# Patient Record
Sex: Male | Born: 1965 | Race: White | Hispanic: No | Marital: Married
Health system: Southern US, Community
[De-identification: ages and names within clinical notes are randomized; demographics above are authoritative.]

## PROBLEM LIST (undated history)

## (undated) DIAGNOSIS — N200 Calculus of kidney: Secondary | ICD-10-CM

## (undated) HISTORY — DX: Calculus of kidney: N20.0

---

## 2003-12-12 ENCOUNTER — Emergency Department (HOSPITAL_COMMUNITY): Admission: EM | Admit: 2003-12-12 | Discharge: 2003-12-12 | Payer: Self-pay | Admitting: Emergency Medicine

## 2007-08-31 ENCOUNTER — Other Ambulatory Visit: Payer: Self-pay

## 2007-08-31 ENCOUNTER — Emergency Department: Payer: Self-pay | Admitting: Internal Medicine

## 2007-09-04 ENCOUNTER — Ambulatory Visit: Payer: Self-pay | Admitting: Urology

## 2007-09-05 ENCOUNTER — Ambulatory Visit: Payer: Self-pay | Admitting: Urology

## 2009-07-14 IMAGING — CR DG ABDOMEN 1V
1 series · 2 of 2 positions shown · non-contrast
Comparison: none

REASON FOR EXAM: pain r flank
COMMENTS:

[Series 1: view not recorded · 0.17mm/px · 2 of 2 slices shown]
[im 1/2]
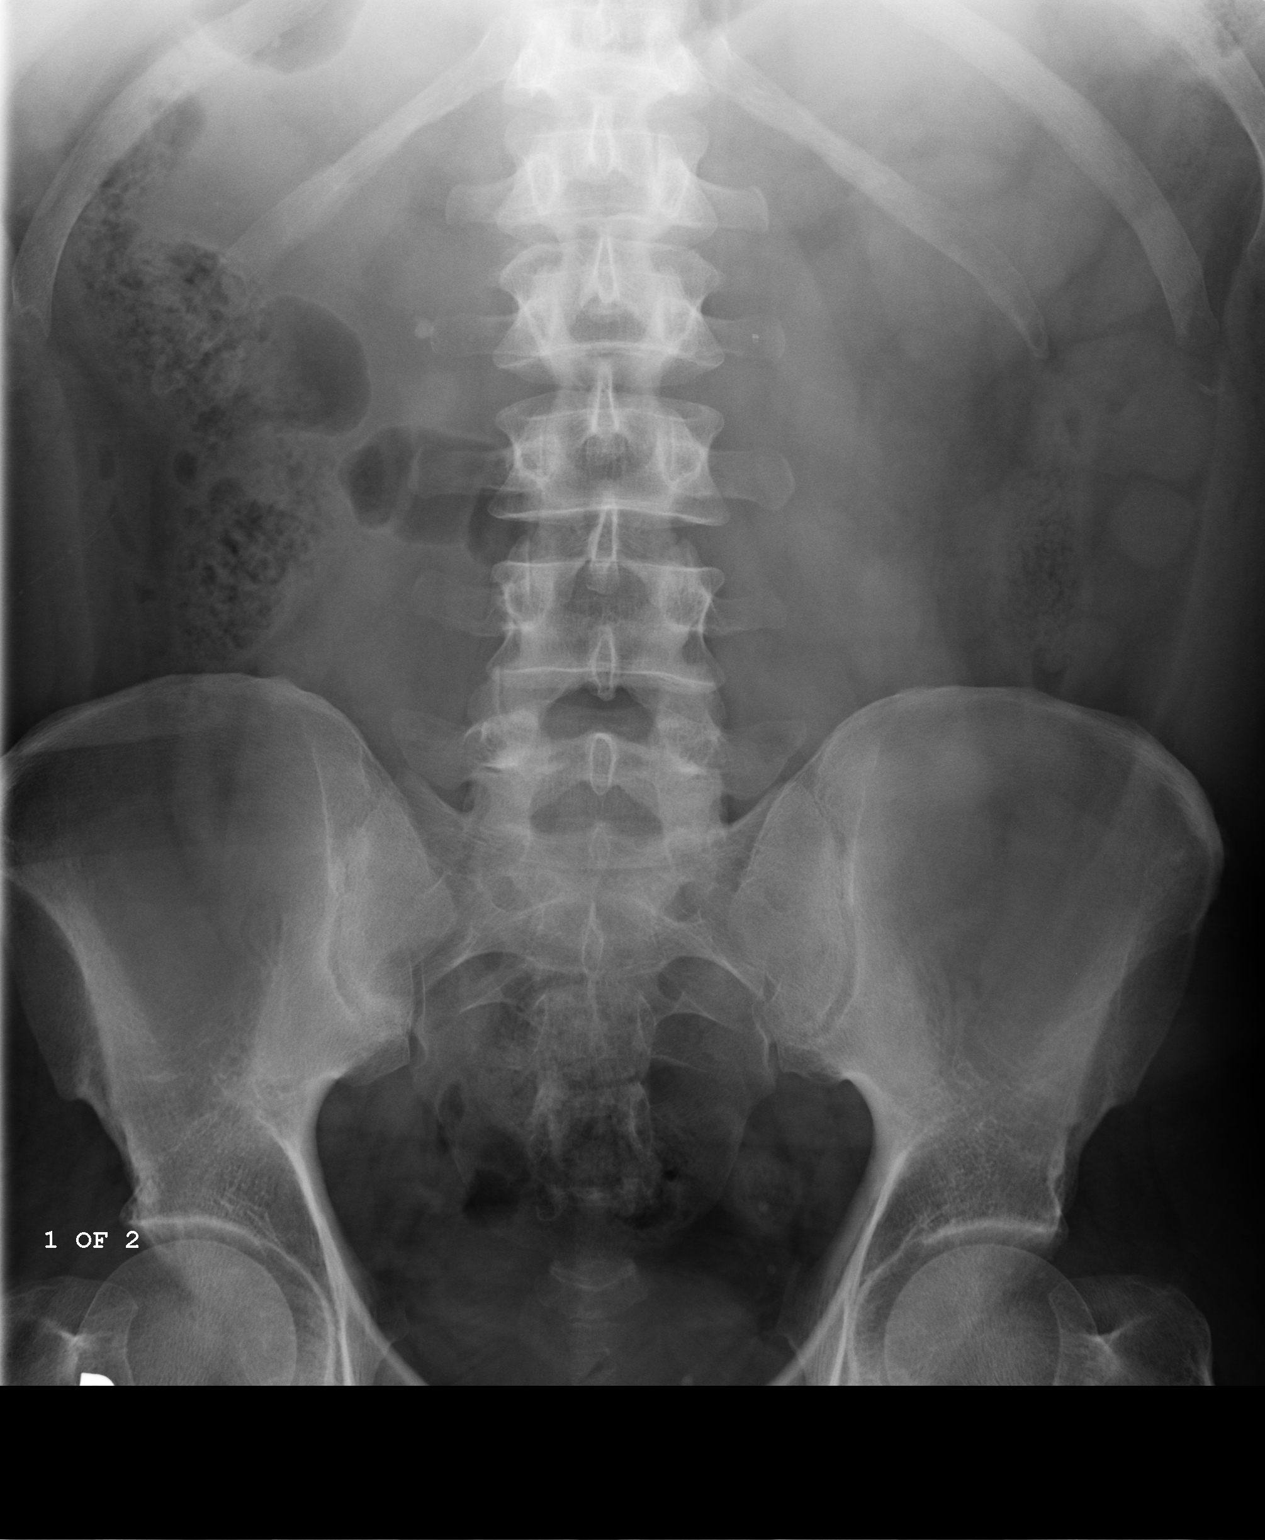
[im 2/2]
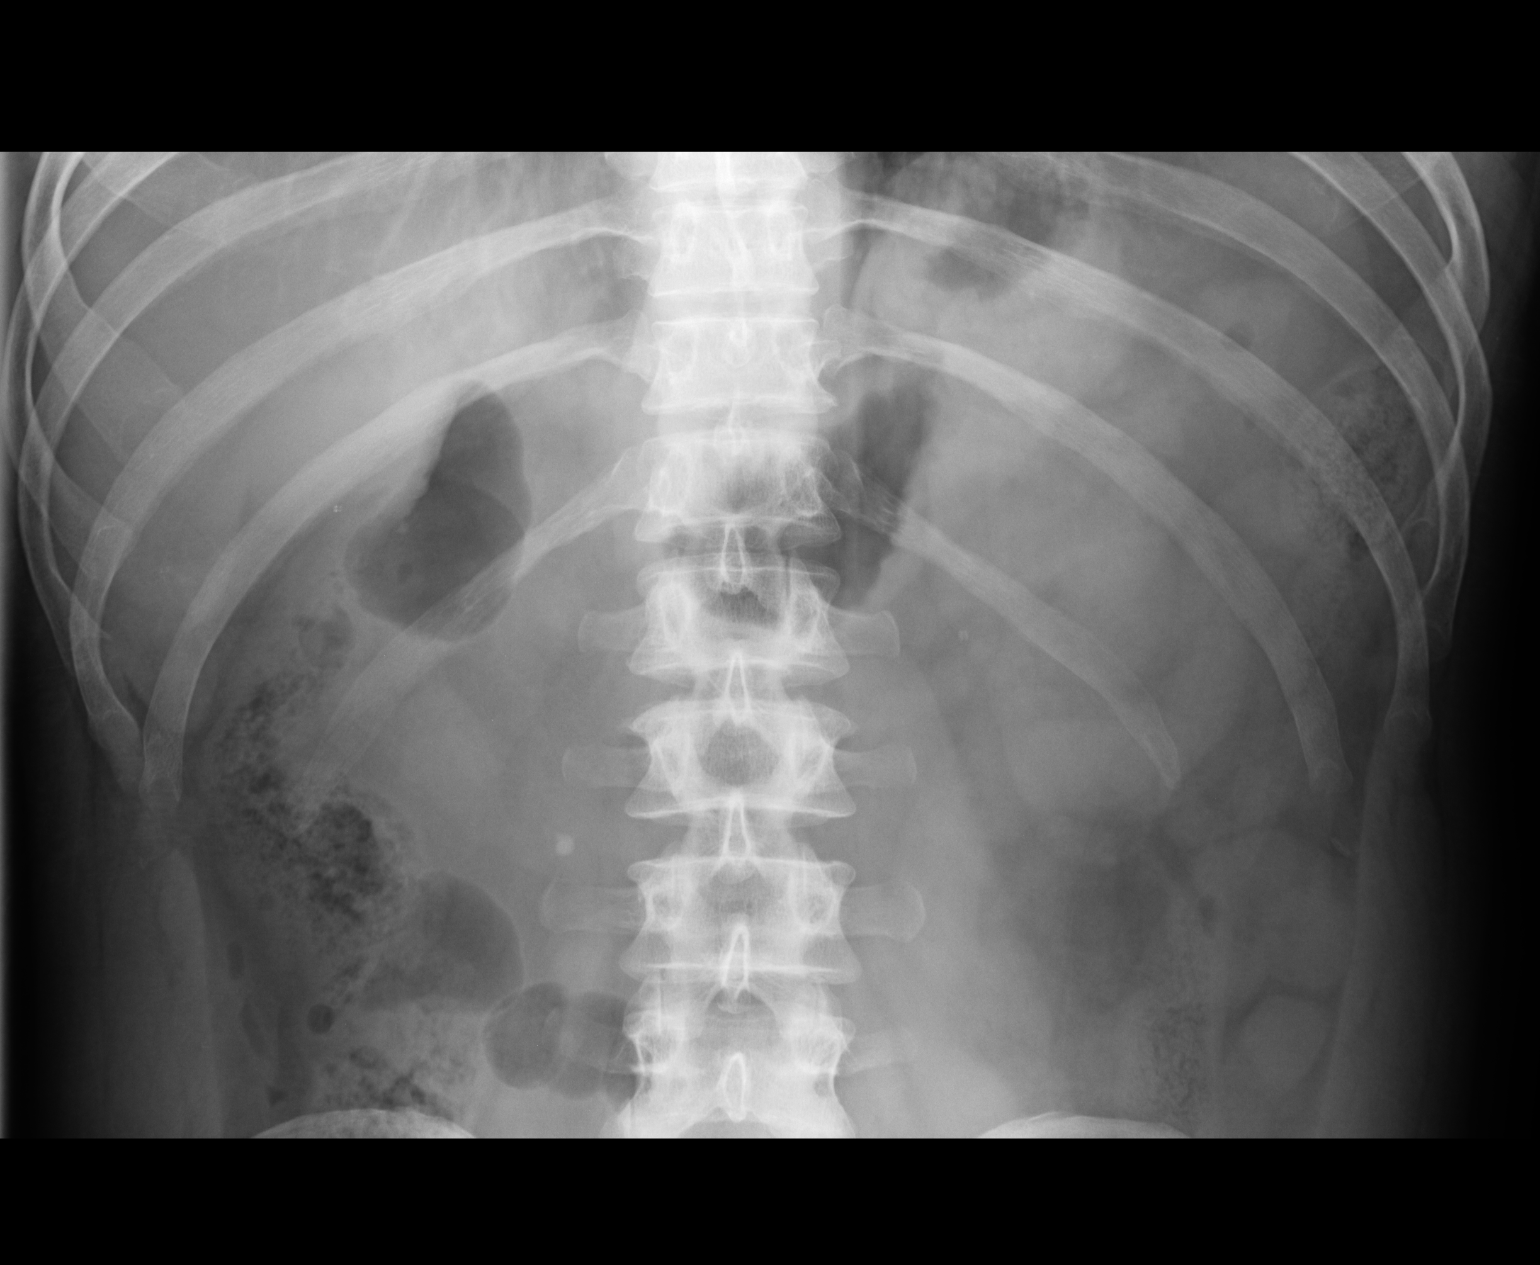

[2 of 2 positions shown; findings below may reference images not displayed]

PROCEDURE:     DXR - DXR KIDNEY URETER BLADDER  - August 31, 2007  [DATE]

RESULT:     The abdominal films reveal a nonspecific bowel gas pattern.
There is a rounded calcification measuring approximately 4 mm in diameter
just above the RIGHT L3 transverse process. This may lie within the proximal
ureter. There is likely a phlebolith in the LEFT aspect of the pelvis. The
bony structures are normal in appearance.
IMPRESSION: There are findings which likely reflect a stone in the
proximal RIGHT ureter. Follow-up CT scanning is available upon request.

## 2010-01-02 ENCOUNTER — Emergency Department: Payer: Self-pay | Admitting: Emergency Medicine

## 2012-09-26 DIAGNOSIS — Z85828 Personal history of other malignant neoplasm of skin: Secondary | ICD-10-CM | POA: Insufficient documentation

## 2014-09-15 DIAGNOSIS — Z8249 Family history of ischemic heart disease and other diseases of the circulatory system: Secondary | ICD-10-CM | POA: Insufficient documentation

## 2014-09-15 DIAGNOSIS — I10 Essential (primary) hypertension: Secondary | ICD-10-CM | POA: Insufficient documentation

## 2016-09-18 DIAGNOSIS — E1165 Type 2 diabetes mellitus with hyperglycemia: Secondary | ICD-10-CM | POA: Insufficient documentation

## 2018-07-16 ENCOUNTER — Other Ambulatory Visit: Payer: Self-pay | Admitting: Internal Medicine

## 2018-07-16 DIAGNOSIS — I10 Essential (primary) hypertension: Secondary | ICD-10-CM

## 2018-07-16 DIAGNOSIS — E119 Type 2 diabetes mellitus without complications: Secondary | ICD-10-CM

## 2018-07-23 ENCOUNTER — Other Ambulatory Visit: Payer: Self-pay | Admitting: Internal Medicine

## 2018-07-23 DIAGNOSIS — E119 Type 2 diabetes mellitus without complications: Secondary | ICD-10-CM

## 2018-07-23 DIAGNOSIS — I10 Essential (primary) hypertension: Secondary | ICD-10-CM

## 2018-07-23 DIAGNOSIS — Z8249 Family history of ischemic heart disease and other diseases of the circulatory system: Secondary | ICD-10-CM

## 2018-07-24 ENCOUNTER — Ambulatory Visit
Admission: RE | Admit: 2018-07-24 | Discharge: 2018-07-24 | Disposition: A | Discharge status: Home / Self Care | Payer: BC Managed Care – PPO | Source: Ambulatory Visit | Attending: Internal Medicine | Admitting: Internal Medicine

## 2018-07-24 DIAGNOSIS — E119 Type 2 diabetes mellitus without complications: Secondary | ICD-10-CM | POA: Insufficient documentation

## 2018-07-24 DIAGNOSIS — Z8249 Family history of ischemic heart disease and other diseases of the circulatory system: Secondary | ICD-10-CM | POA: Diagnosis present

## 2018-07-24 DIAGNOSIS — I10 Essential (primary) hypertension: Secondary | ICD-10-CM | POA: Diagnosis present

## 2018-12-25 DIAGNOSIS — D126 Benign neoplasm of colon, unspecified: Secondary | ICD-10-CM | POA: Insufficient documentation

## 2019-05-27 ENCOUNTER — Other Ambulatory Visit: Payer: Self-pay

## 2019-05-27 DIAGNOSIS — Z20822 Contact with and (suspected) exposure to covid-19: Secondary | ICD-10-CM

## 2019-05-28 LAB — NOVEL CORONAVIRUS, NAA: SARS-CoV-2, NAA: DETECTED — AB

## 2019-05-30 ENCOUNTER — Telehealth: Payer: Self-pay | Admitting: Unknown Physician Specialty

## 2019-05-30 NOTE — Telephone Encounter (Signed)
I connected by phone with Tanner Schaefer on 05/30/2019 at 12:49 PM to discuss the potential use of an new treatment for mild to moderate COVID-19 viral infection in non-hospitalized patients.3 days of symptoms with temperature and flu like symptoms.    This patient is a 53 y.o. male that meets the FDA criteria for Emergency Use Authorization of bamlanivimab or casirivimab\imdevimab.  Has a (+) direct SARS-CoV-2 viral test result  Has mild or moderate COVID-19   Is ? 51 years of age and weighs ? 40 kg  Is NOT hospitalized due to COVID-19  Is NOT requiring oxygen therapy or requiring an increase in baseline oxygen flow rate due to COVID-19  Is within 10 days of symptom onset  Has at least one of the high risk factor(s) for progression to severe COVID-19 and/or hospitalization as defined in EUA.  Specific high risk criteria : Diabetes   I have spoken and communicated the following to the patient or parent/caregiver:  1. FDA has authorized the emergency use of bamlanivimab and casirivimab\imdevimab for the treatment of mild to moderate COVID-19 in adults and pediatric patients with positive results of direct SARS-CoV-2 viral testing who are 14 years of age and older weighing at least 40 kg, and who are at high risk for progressing to severe COVID-19 and/or hospitalization.  2. The significant known and potential risks and benefits of bamlanivimab and casirivimab\imdevimab, and the extent to which such potential risks and benefits are unknown.  3. Information on available alternative treatments and the risks and benefits of those alternatives, including clinical trials.  4. Patients treated with bamlanivimab and casirivimab\imdevimab should continue to self-isolate and use infection control measures (e.g., wear mask, isolate, social distance, avoid sharing personal items, clean and disinfect "high touch" surfaces, and frequent handwashing) according to CDC guidelines.   5. The patient or  parent/caregiver has the option to accept or refuse bamlanivimab or casirivimab\imdevimab .  After reviewing this information with the patient, The patient has DECLINED offer to receive the infusion.  Kathrine Haddock 05/30/2019 12:49 PM

## 2019-09-30 DIAGNOSIS — Z8616 Personal history of COVID-19: Secondary | ICD-10-CM | POA: Insufficient documentation

## 2020-03-11 IMAGING — US US AORTA SCREENING (MEDICARE)
1 series · 14 of 20 positions shown · non-contrast
Comparison: CT abdomen and pelvis - 08/21/2007

CLINICAL DATA: Family history of abdominal aortic aneurysm.
Personal history of hypertension and diabetes.

EXAM:
ULTRASOUND OF ABDOMINAL AORTA
TECHNIQUE: Ultrasound examination of the abdominal aorta and proximal common
iliac arteries was performed to evaluate for aneurysm. Additional
color and Doppler images of the distal aorta were obtained to
document patency.

[Series 1: us aorta screening (medicare) · 0.31mm/px · 14 of 20 slices shown]
[im 1/20]
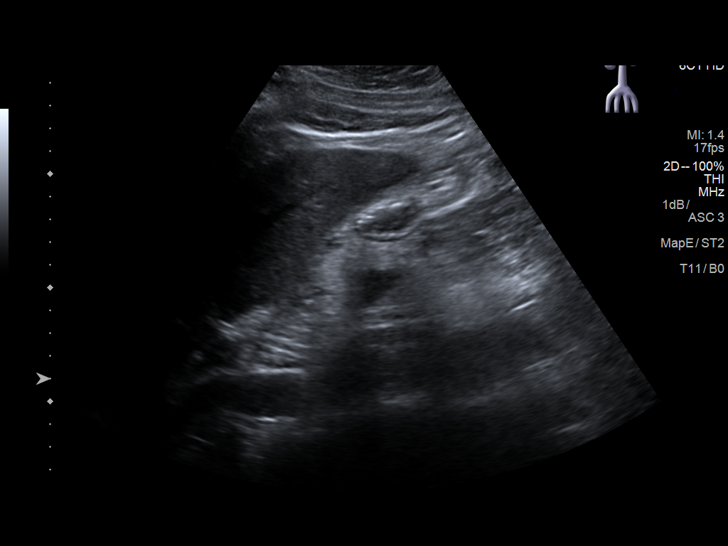
[im 3/20]
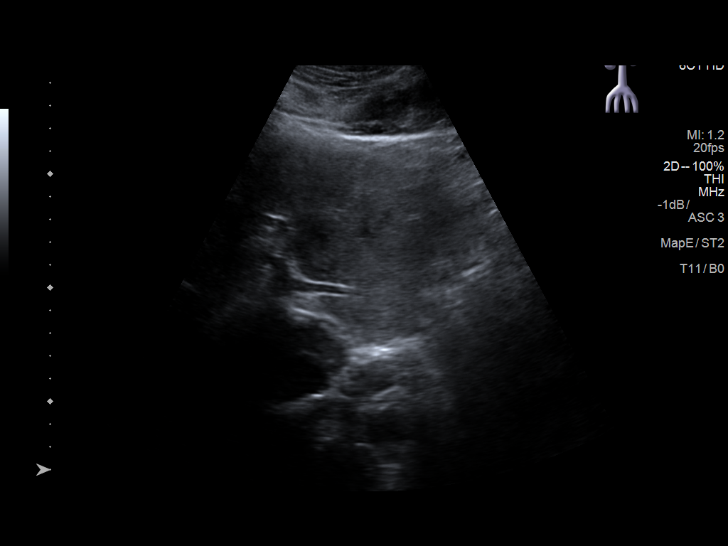
[im 4/20]
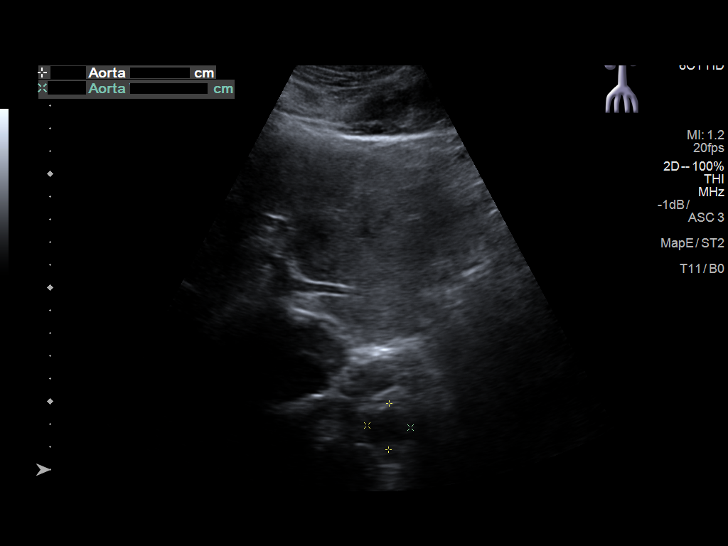
[im 6/20]
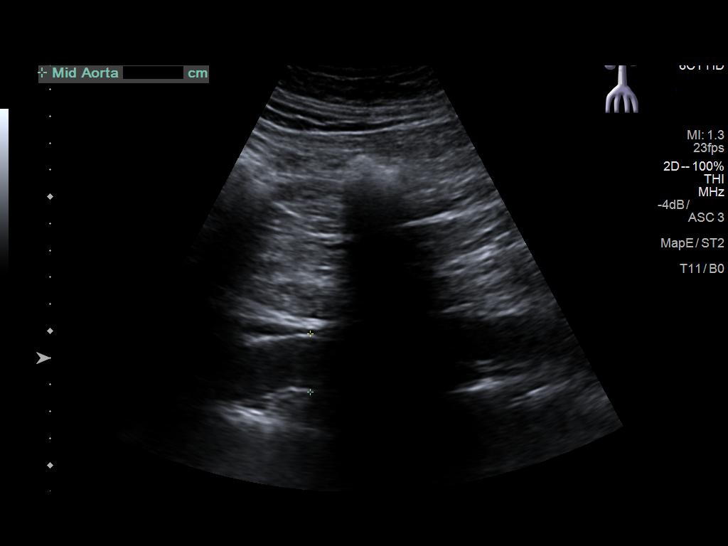
[im 7/20]
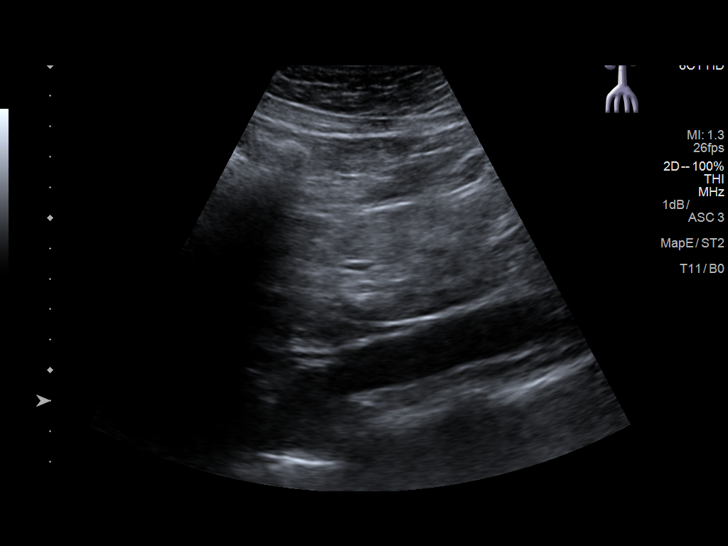
[im 8/20]
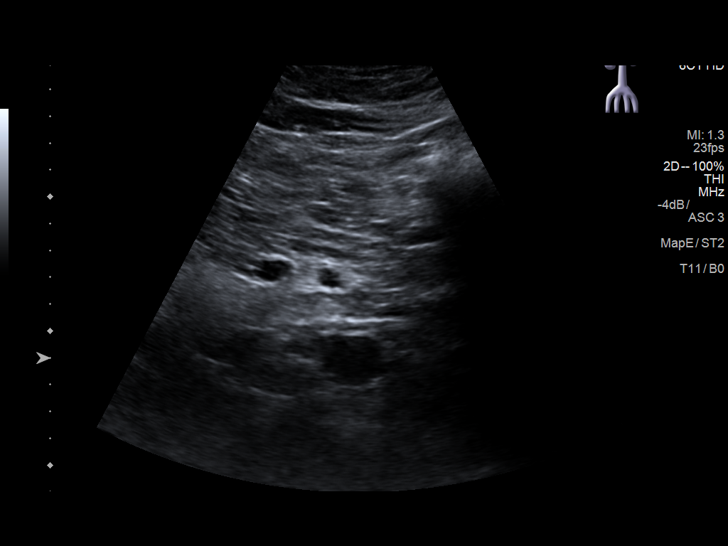
[im 10/20]
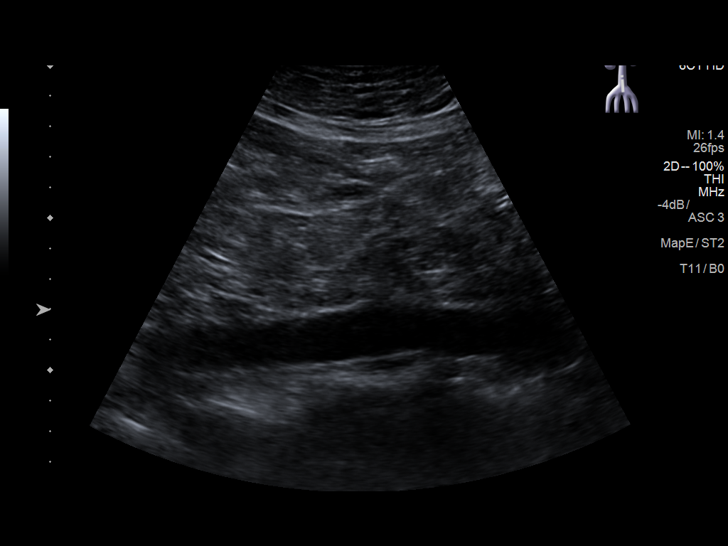
[im 11/20]
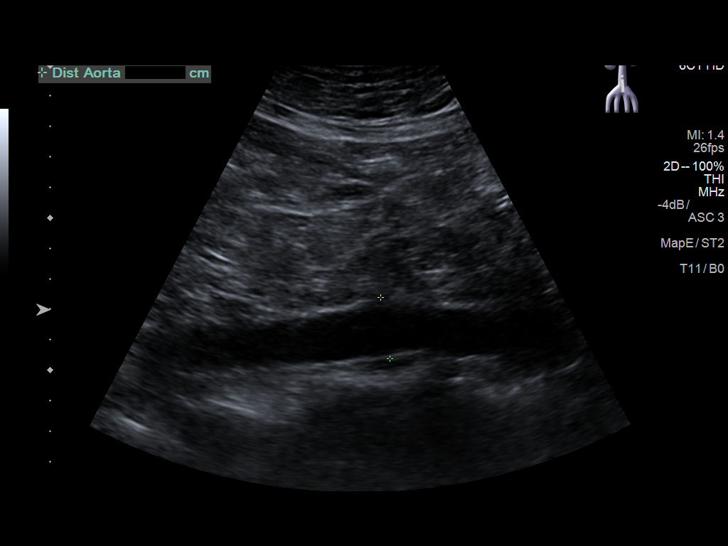
[im 13/20]
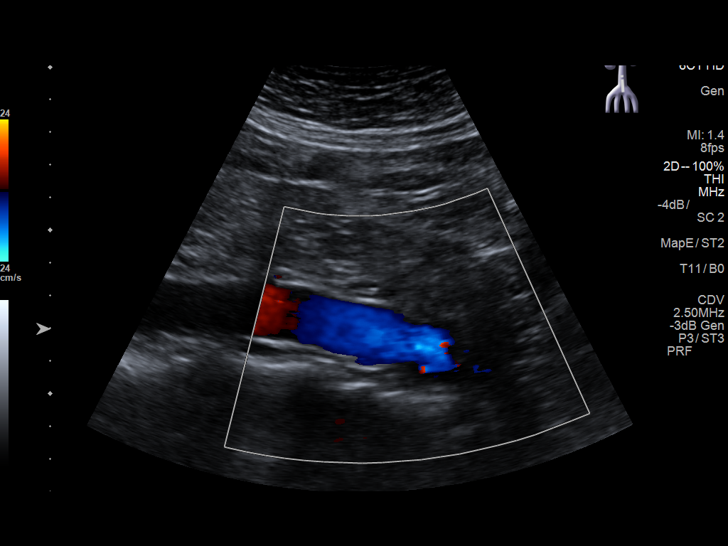
[im 14/20]
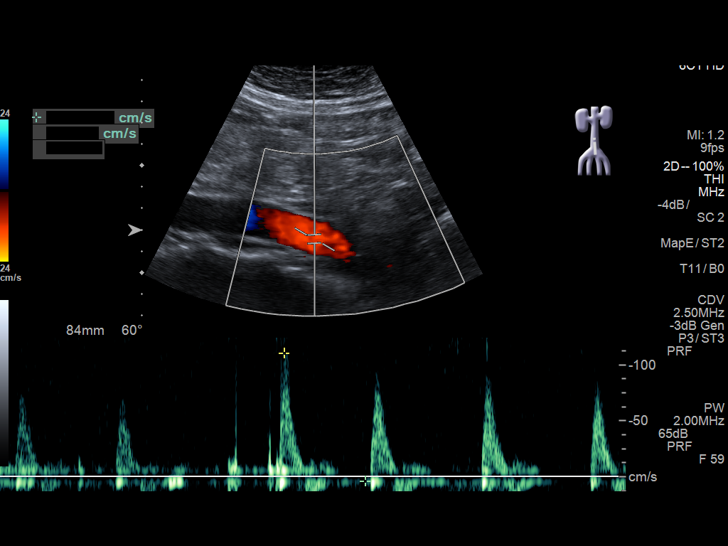
[im 16/20]
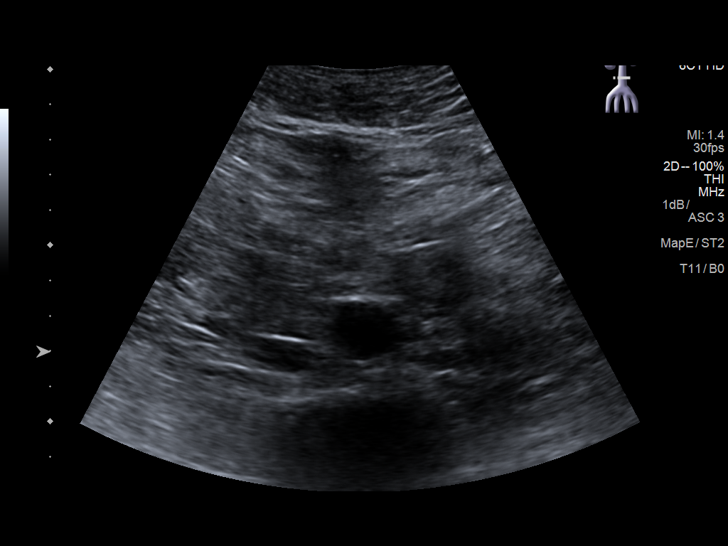
[im 17/20]
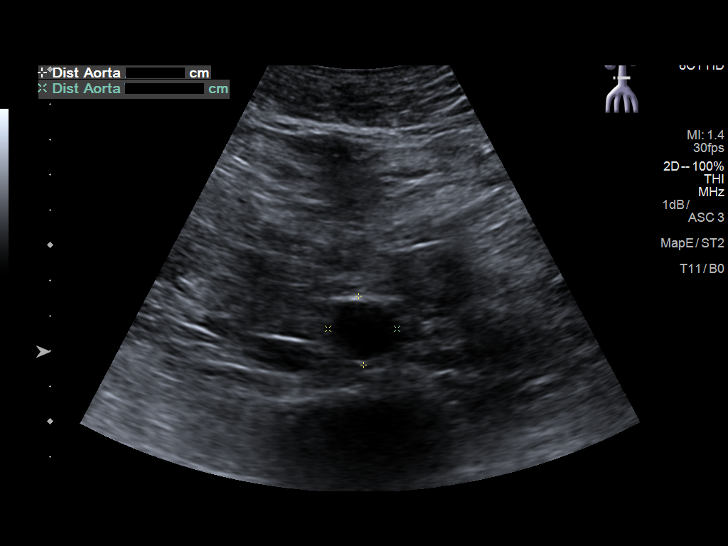
[im 18/20]
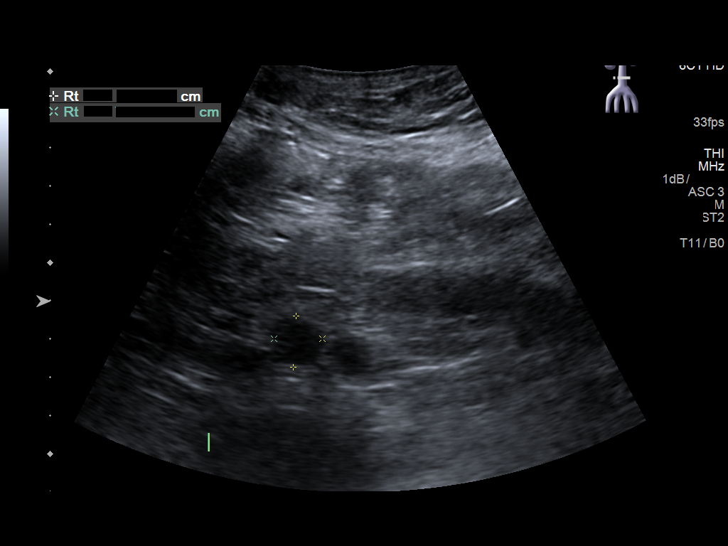
[im 20/20]
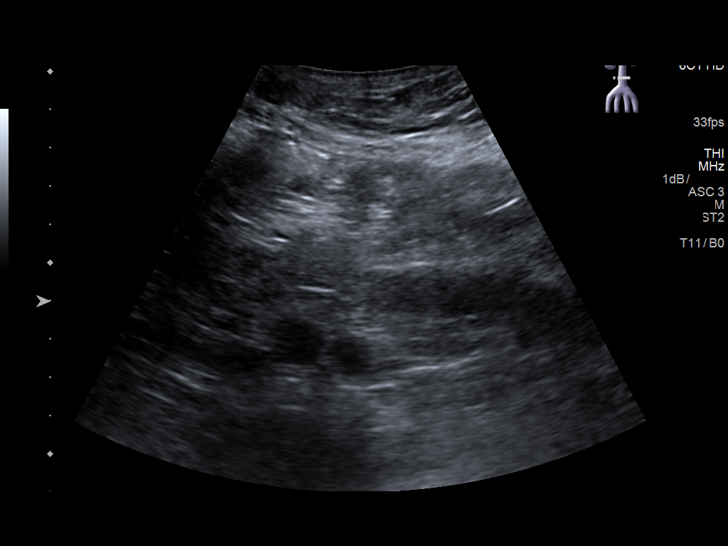

[14 of 20 positions shown; findings below may reference images not displayed]

FINDINGS: Abdominal aortic measurements as follows:

Proximal:  2.4 x 1.9 cm

Mid:  2.3 x 2.2 cm

Distal:  2.0 x 2.0 cm
Patent: Yes, peak systolic velocity is 111 cm/s

Right common iliac artery: 1.3 x 1.3 cm

Left common iliac artery: 1.3 x 1.1 cm
IMPRESSION: No evidence of abdominal aortic aneurysm.

## 2021-04-05 ENCOUNTER — Other Ambulatory Visit: Payer: Self-pay | Admitting: Internal Medicine

## 2021-04-05 DIAGNOSIS — Z8249 Family history of ischemic heart disease and other diseases of the circulatory system: Secondary | ICD-10-CM

## 2021-04-15 ENCOUNTER — Ambulatory Visit: Payer: BC Managed Care – PPO

## 2021-04-22 ENCOUNTER — Ambulatory Visit
Admission: RE | Admit: 2021-04-22 | Discharge: 2021-04-22 | Disposition: A | Discharge status: Home / Self Care | Payer: BC Managed Care – PPO | Source: Ambulatory Visit | Attending: Internal Medicine | Admitting: Internal Medicine

## 2021-04-22 ENCOUNTER — Other Ambulatory Visit: Payer: Self-pay

## 2021-04-22 DIAGNOSIS — Z8249 Family history of ischemic heart disease and other diseases of the circulatory system: Secondary | ICD-10-CM | POA: Insufficient documentation

## 2023-03-06 IMAGING — US US AORTA
2 series · 15 of 25 positions shown · non-contrast
Comparison: 07/24/2018

CLINICAL DATA: Family history of abdominal aortic aneurysm and
history hypertension.

EXAM:
ULTRASOUND OF ABDOMINAL AORTA
TECHNIQUE: Ultrasound examination of the abdominal aorta and proximal common
iliac arteries was performed to evaluate for aneurysm. Additional
color and Doppler images of the distal aorta were obtained to
document patency.

[Series 1: us aaa duplex (id) · 9 of 29 slices shown (1 of 2)]
[im 1/29]
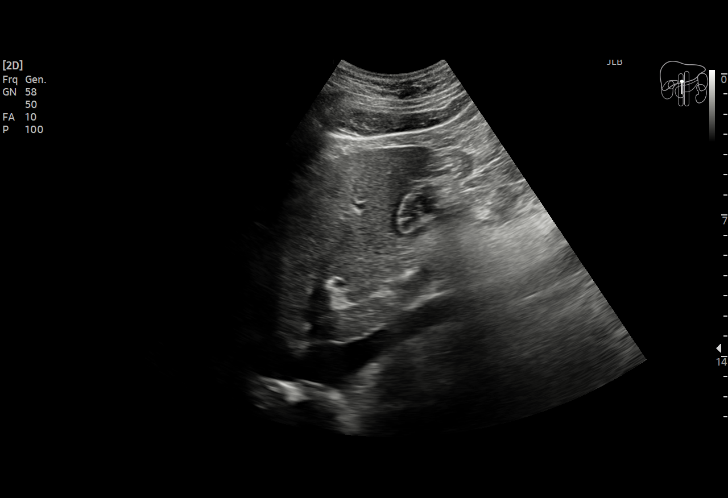
[im 5/29]
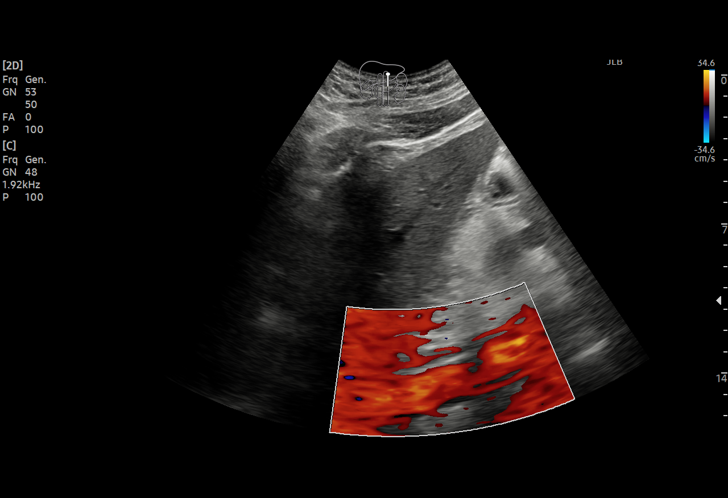
[im 9/29]
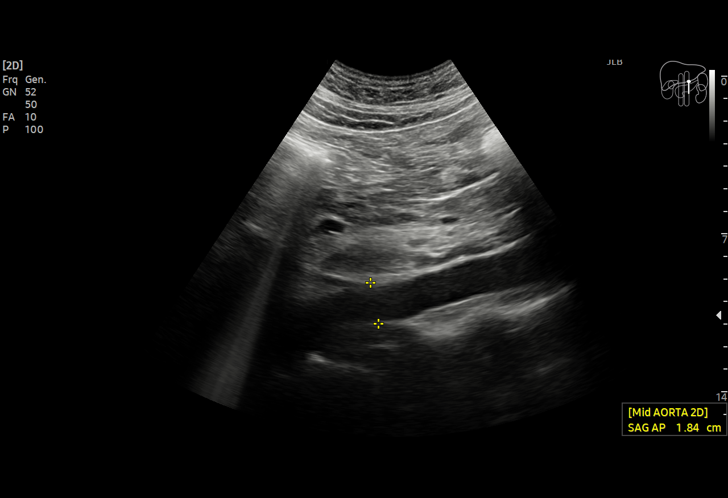
[im 11/29]
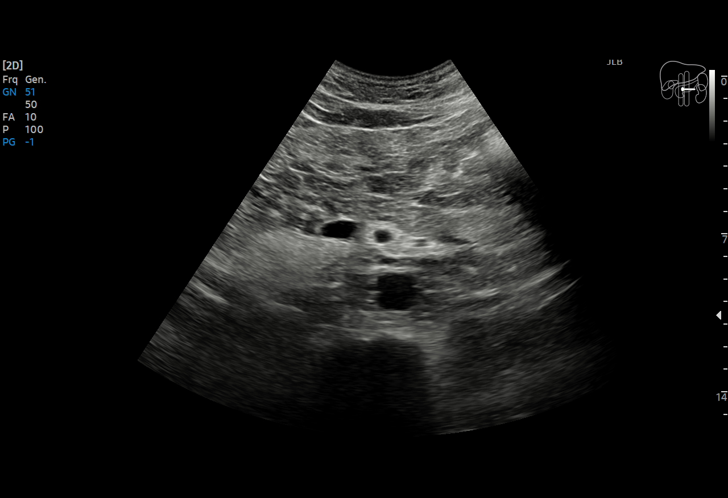
[im 15/29]
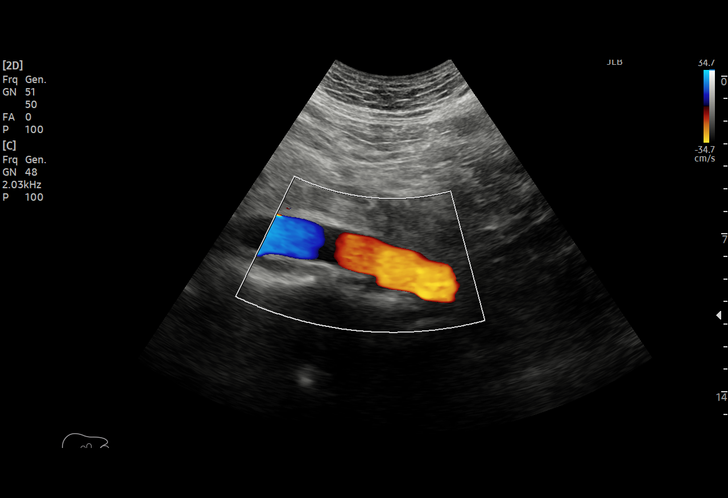
[im 19/29]
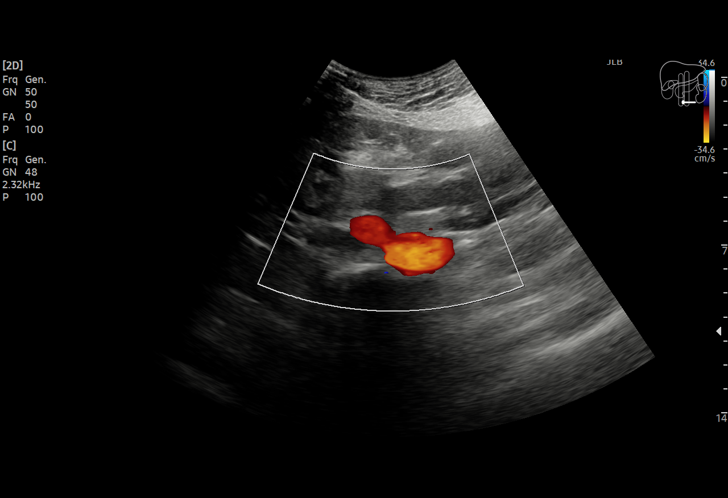
[im 21/29]
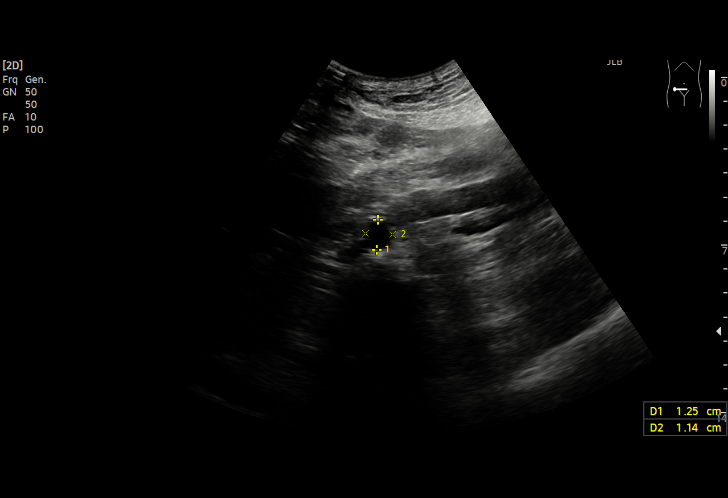
[im 25/29]
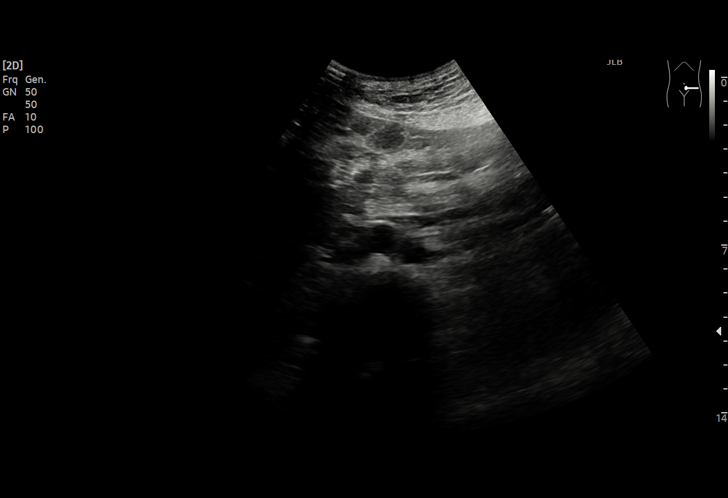
[im 29/29]
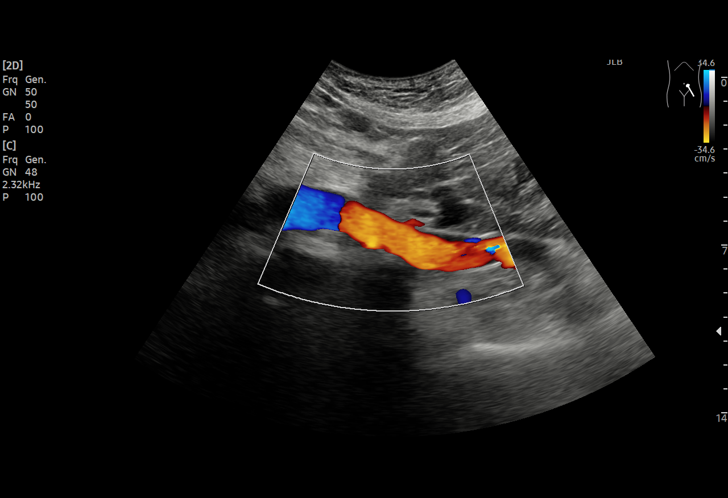

[Series 3: us aaa duplex (id) · 6 of 20 slices shown (2 of 2)]
[im 1/20]
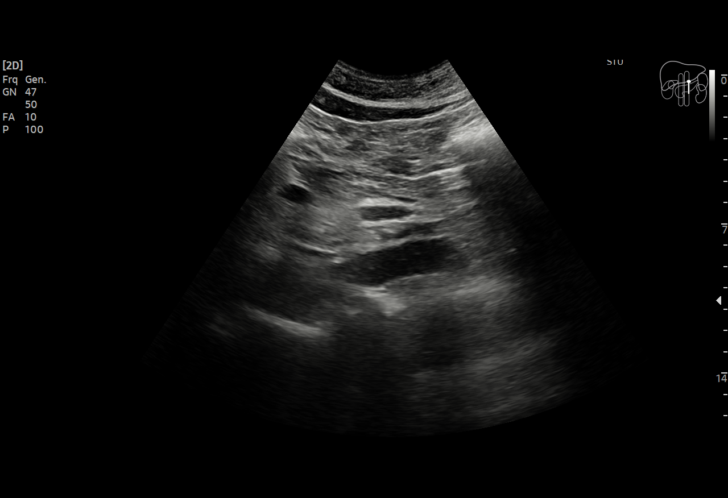
[im 5/20]
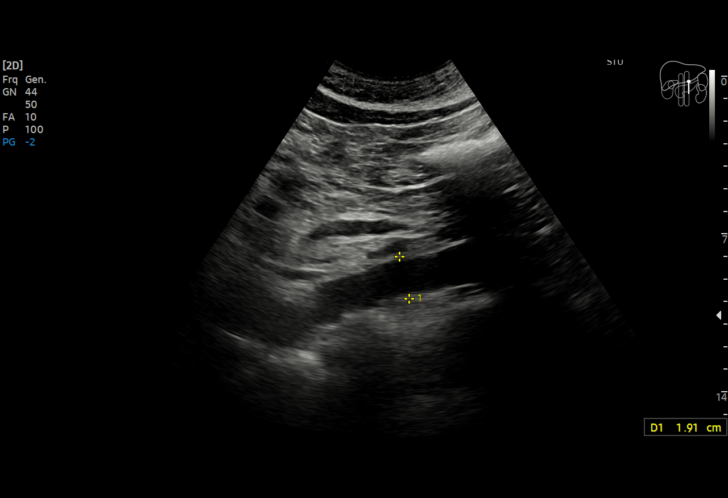
[im 9/20]
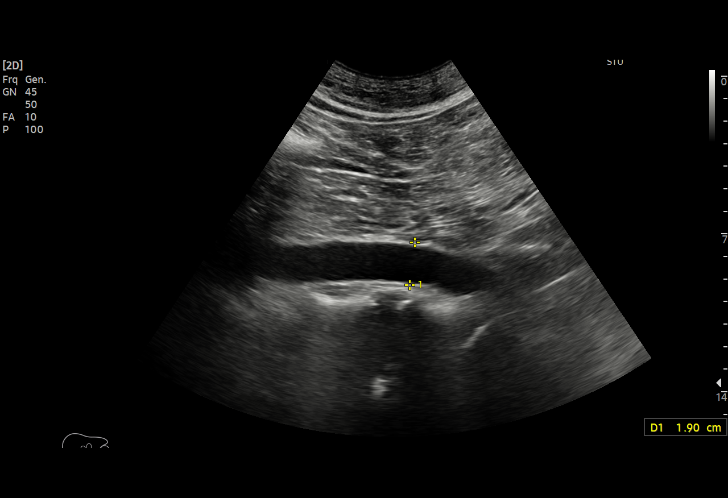
[im 11/20]
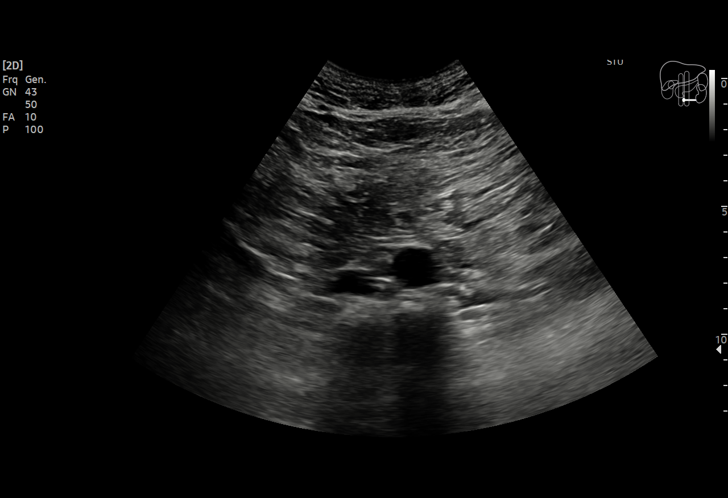
[im 15/20]
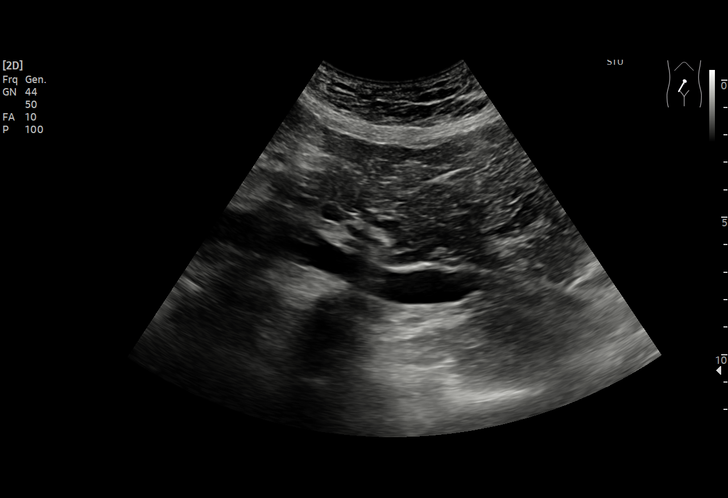
[im 20/20]
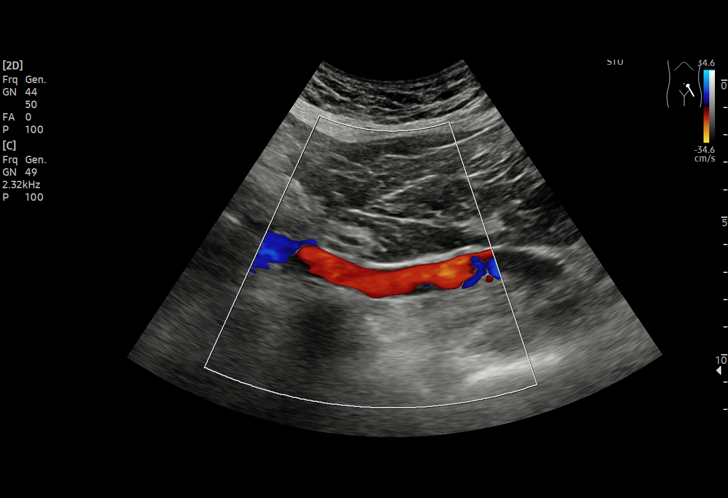

[15 of 25 positions shown; findings below may reference images not displayed]

FINDINGS: Abdominal aortic measurements as follows:

Proximal:  1.9 x 2.1 cm

Mid:  1.8 x 1.9 cm

Distal:  1.9 x 1.9 cm
Patent: Yes, peak systolic velocity is 100 cm/s

Stable mild atherosclerosis of the abdominal aorta.

Right common iliac artery: 1.3 cm

Left common iliac artery: 1.3 cm
IMPRESSION: Stable mild atherosclerosis of the abdominal aorta. No evidence of
aneurysmal disease.

## 2023-04-16 DIAGNOSIS — R7689 Other specified abnormal immunological findings in serum: Secondary | ICD-10-CM | POA: Insufficient documentation

## 2024-03-12 ENCOUNTER — Other Ambulatory Visit: Payer: Self-pay | Admitting: Internal Medicine

## 2024-03-12 ENCOUNTER — Ambulatory Visit
Admission: RE | Admit: 2024-03-12 | Discharge: 2024-03-12 | Disposition: A | Discharge status: Home / Self Care | Source: Ambulatory Visit | Attending: Internal Medicine | Admitting: Internal Medicine

## 2024-03-12 DIAGNOSIS — R109 Unspecified abdominal pain: Secondary | ICD-10-CM | POA: Diagnosis present

## 2024-03-12 DIAGNOSIS — R1032 Left lower quadrant pain: Secondary | ICD-10-CM | POA: Insufficient documentation

## 2024-03-20 ENCOUNTER — Other Ambulatory Visit: Payer: Self-pay | Admitting: Urology

## 2024-03-20 ENCOUNTER — Other Ambulatory Visit: Payer: Self-pay

## 2024-03-20 ENCOUNTER — Ambulatory Visit
Admission: RE | Admit: 2024-03-20 | Discharge: 2024-03-20 | Disposition: A | Discharge status: Home / Self Care | Attending: Urology | Admitting: Urology

## 2024-03-20 ENCOUNTER — Encounter: Payer: Self-pay | Admitting: Urology

## 2024-03-20 ENCOUNTER — Ambulatory Visit: Admitting: Urology

## 2024-03-20 ENCOUNTER — Ambulatory Visit
Admission: RE | Admit: 2024-03-20 | Discharge: 2024-03-20 | Disposition: A | Discharge status: Home / Self Care | Source: Ambulatory Visit | Attending: Urology | Admitting: Urology

## 2024-03-20 VITALS — BP 178/99 | HR 71 | Ht 67.0 in | Wt 215.4 lb

## 2024-03-20 DIAGNOSIS — N201 Calculus of ureter: Secondary | ICD-10-CM | POA: Diagnosis not present

## 2024-03-20 MED ORDER — KETOROLAC TROMETHAMINE 10 MG PO TABS: 10.0000 mg | ORAL_TABLET | Freq: Four times a day (QID) | ORAL | 0 refills | Status: AC | PRN

## 2024-03-20 MED ORDER — TAMSULOSIN HCL 0.4 MG PO CAPS: 0.4000 mg | ORAL_CAPSULE | Freq: Every day | ORAL | 0 refills | Status: DC

## 2024-03-20 NOTE — Progress Notes (Signed)
   03/20/24 9:29 AM   Tanner Schaefer 11/11/1965 [id number]  CC: Left ureteral stone  HPI: 58 year old male who presented to his PCP on 03/12/2024 with left-sided flank pain, urinalysis showed microscopic hematuria and CT showed a 5 mm left proximal ureteral stone with hydronephrosis.  Small bilateral nonobstructive renal stones as well.  His pain has resolved over the last week.  He denies any fevers or chills.  He has a history of recurrent stones at least 3 previously, has required shockwave and ureteroscopy in the distant past.  He recently started some leftover Flomax he had at home.   PMH: Past Medical History:  Diagnosis Date   Kidney stone     Surgical History: History reviewed. No pertinent surgical history.    Family History: History reviewed. No pertinent family history.  Social History:  reports that he does not use drugs. No history on file for tobacco use and alcohol use.  Physical Exam: BP (!) 178/99 (BP Location: Left Arm, Patient Position: Sitting, Cuff Size: Large)   Pulse 71   Ht 5' 7 (1.702 m)   Wt 215 lb 6.4 oz (97.7 kg)   SpO2 96%   BMI 33.74 kg/m    Constitutional:  Alert and oriented, No acute distress. Cardiovascular: No clubbing, cyanosis, or edema. Respiratory: Normal respiratory effort, no increased work of breathing. GI: Abdomen is soft, nontender, nondistended, no abdominal masses   Laboratory Data: See HPI  Pertinent Imaging: I have personally viewed and interpreted the KUB today that appears to show migration of the 5 mm stone to the mid ureter overlying the pelvis.  Assessment & Plan:   58 year old male with a 5 mm left mid ureteral stone with no laboratory or clinical evidence of infection.  We discussed various treatment options for urolithiasis including observation with or without medical expulsive therapy, shockwave lithotripsy (SWL), ureteroscopy and laser lithotripsy with stent placement, and percutaneous nephrolithotomy.We  discussed that management is based on stone size, location, density, patient co-morbidities, and patient preference. Stones <62mm in size have a >80% spontaneous passage rate. Data surrounding the use of tamsulosin for medical expulsive therapy is controversial, but meta analyses suggests it is most efficacious for distal stones between 5-23mm in size. Possible side effects include dizziness/lightheadedness, and retrograde ejaculation.SWL has a lower stone free rate in a single procedure, but also a lower complication rate compared to ureteroscopy and avoids a stent and associated stent related symptoms. Possible complications include renal hematoma, steinstrasse, and need for additional treatment.Ureteroscopy with laser lithotripsy and stent placement has a higher stone free rate than SWL in a single procedure, however increased complication rate including possible infection, ureteral injury, bleeding, and stent related morbidity. Common stent related symptoms include dysuria, urgency/frequency, and flank pain.  He prefers trial of medical expulsive therapy, will coordinate shockwave lithotripsy in 2 weeks, clinic visit a few days prior for repeat x-ray, can cancel if he passes his stone Flomax sent in Return precautions discussed with  Redell Burnet, MD 03/20/2024  [facility] [address] [place], [place] [address] [phone]

## 2024-03-20 NOTE — Progress Notes (Signed)
 ESWL ORDER FORM  Expected date of procedure: ~2 weeks (needs clinic visit Monday or Tuesday prior to procedure for repeat KUB and symptom check, UA) can be with me or PA  Surgeon: MD on truck  Post op standing: 2-4wk follow up w/KUB prior  Anticoagulation/Aspirin/NSAID standing order: Hold all 72 hours prior  Anesthesia standing order: MAC  VTE standing: SCD's  Dx: Left Ureteral Stone  Procedure: left Extracorporeal shock wave lithotripsy  CPT : 50590  Standing Order Set:   *NPO after mn, KUB  *NS 141ml/hr, Keflex 500mg  PO, Benadryl 25mg  PO, Valium 10mg  PO, Zofran 4mg  IV  Medications if other than standing orders:   NONE

## 2024-03-20 NOTE — Progress Notes (Signed)
    [facility] ESWL POSTING SHEET        Patient Name: Tanner Schaefer  DOB: 09/18/1965  MRN: [id number]  Surgeon:  Redell Burnet, MD  Diagnosis:  Left Nephrolithiasis  CPT: 49409  ESWL DATE: 04/03/2024  ESWL TIME: 0730  Special Needs/Requirements: None       Cardiac/Medical/Pulmonary Clearance needed: No       Form Faxed to [facility]- [phone] Date:   Date: 03/20/24       Form Faxed to [place]- [phone]  Date:  Date: 03/20/24           Copy Made for Insurance PA:  Date: 03/20/24       Orders Entered in to Epic:  Date: 03/20/24              7

## 2024-03-20 NOTE — Patient Instructions (Signed)
 ESWL for Kidney Stones  Extracorporeal shock wave lithotripsy (ESWL) is a treatment that can help break up kidney stones that are too large to pass on their own.  This is a nonsurgical procedure that breaks up a kidney stone with shock waves. These shock waves pass through your body and focus on the kidney stone. They cause the kidney stone to break into smaller pieces (fragments) while it is still in the urinary tract. The fragments of stone can pass more easily out of your body in the pee (urine). Tell a health care provider about: Any allergies you have. All medicines you are taking, including vitamins, herbs, eye drops, creams, and over-the-counter medicines. Any problems you or family members have had with anesthetic medicines. Any bleeding problems you have. Any surgeries you've had. Any medical conditions you have. Whether you're pregnant or may be pregnant. What are the risks? Your health care provider will talk with you about risks. These may include: Infection. Bleeding from the kidney. Bruising of the kidney or skin. Scarring of the kidney. This can lead to: Increased blood pressure. Poor kidney function. Return (recurrence) of kidney stones. Damage to other structures or organs. This may include the liver, colon, spleen, or pancreas. Blockage (obstruction) of the tube that carries pee from the kidney to the bladder (ureter). Failure of the kidney stone to break into fragments. What happens before the procedure? When to stop eating and drinking Follow instructions from your health care provider about what you may eat and drink. These may include: 8 hours before your procedure Stop eating most foods. Do not eat meat, fried foods, or fatty foods. Eat only light foods, such as toast or crackers. All liquids are okay except energy drinks and alcohol. 6 hours before your procedure Stop eating. Drink only clear liquids, such as water, clear fruit juice, black coffee, plain tea,  and sports drinks. Do not drink energy drinks or alcohol. 2 hours before your procedure Stop drinking all liquids. You may be allowed to take medicines with small sips of water. If you do not follow your health care provider's instructions, your procedure may be delayed or canceled. Medicines Ask your health care provider about: Changing or stopping your regular medicines. These include any diabetes medicines or blood thinners you take. Taking medicines such as aspirin and ibuprofen. These medicines can thin your blood. Do not take them unless your health care provider tells you to. Taking over-the-counter medicines, vitamins, herbs, and supplements. Tests You may have tests, such as: Blood tests. Pee (urine) tests. Imaging tests. This may include a CT scan. Surgery safety Ask your health care provider: How your surgery site will be marked. What steps will be taken to help prevent infection. These steps may include: Washing skin with a soap that kills germs. Receiving antibiotics. General instructions If you will be going home right after the procedure, plan to have a responsible adult: Take you home from the hospital or clinic. You will not be allowed to drive. Care for you for the time you are told. What happens during the procedure?  An IV will be inserted into one of your veins. You may be given: A sedative. This helps you relax. Anesthesia. This will: Numb certain areas of your body. Make you fall asleep for surgery. A water-filled cushion may be placed behind your kidney or on your abdomen. In some cases, you may be placed in a tub of lukewarm water. Your body will be positioned in a way that makes it  easier to target the kidney stone. An X-ray or ultrasound exam will be done to locate your stone. Shock waves will be aimed at the stone. If you are awake, you may feel a tapping sensation as the shock waves pass through your body. A small mesh tube (stent) may be placed in  your ureter. This will help keep pee flowing from the kidney if the fragments of the stone have been blocking the ureter. The stent will be removed at a later time by your health care provider. The procedure may vary among health care providers and hospitals. What happens after the procedure? Your blood pressure, heart rate, breathing rate, and blood oxygen level will be monitored until you leave the hospital or clinic. You may have an X-ray after the procedure to see how many of the kidney stones were broken up. This will also show how much of the stone has passed. If there are still large fragments after treatment, you may need to have a second procedure at a later time. This information is not intended to replace advice given to you by your health care provider. Make sure you discuss any questions you have with your health care provider.

## 2024-03-21 ENCOUNTER — Encounter

## 2024-03-30 NOTE — Progress Notes (Unsigned)
 03/31/2024 9:22 AM   Tanner Schaefer [birth year] [id number]  Referring provider: Sadie Manna, MD [address] [facility] [place],  [place] [address]  Urological history: 1. Nephrolithiasis - remote history of ESWL x 3  No chief complaint on file.  HPI: Tanner Schaefer is a 58 y.o. man who presents today for 2 week follow up after MET for a 5 mm stone in the left mid ureter.    Previous records reviewed.  He was seen by Tanner Schaefer on 03/20/2024 after a CT showed a 5 mm left proximal ureteral stone with hydronephrosis along with small bilateral non obstructive renal stones.  KUB demonstrated migration of that stone into the left mid ureter.  They discussed treatment options and Tanner Schaefer decided on a trial of MET.  He is tentatively scheduled for ESWL on 04/03/2024.    I PSS ***  He reports sensation of incomplete bladder emptying, urinary frequency, urinary intermittency, urinary urgency, a weak urinary stream, having to strain to void, nocturia x ***, leaking before being able to reach the restroom, leaking with coughing, leaking without awareness, and post void dribbling.     He is wearing *** pads//depends  daily.    Patient denies any modifying or aggravating factors.  Patient denies any recent UTI's, gross hematuria, dysuria or suprapubic/flank pain.  Patient denies any fevers, chills, nausea or vomiting.  ***  He has a family history of PCa, colon cancer, ovarian cancer and/or breast cancer with ***.   He does not have a family history of PCa, colon cancer, ovarian cancer, and/or breast cancer .***     UA***  KUB ***  PSA (09/2023) 2.15  Serum creatinine (09/2023) 1.2, eGFR 71  Hemoglobin A1c (09/2023) 6.9   Fluid consumptiom: ***  PMH: Past Medical History:  Diagnosis Date   Kidney stone     Surgical History: No past surgical history on file.  Home Medications:  Allergies as of 03/31/2024   No Known Allergies      Medication  List        Accurate as of March 30, 2024  9:22 AM. If you have any questions, ask your nurse or doctor.          aspirin 81 MG chewable tablet Chew 81 mg by mouth daily.   atenolol 25 MG tablet Commonly known as: TENORMIN Take 25 mg by mouth daily.   Januvia 100 MG tablet Generic drug: sitaGLIPtin Take 100 mg by mouth daily.   ketorolac 10 MG tablet Commonly known as: TORADOL Take 1 tablet (10 mg total) by mouth every 6 (six) hours as needed.   losartan 100 MG tablet Commonly known as: COZAAR Take 100 mg by mouth daily.   metFORMIN 500 MG tablet Commonly known as: GLUCOPHAGE Take 500 mg by mouth 2 (two) times daily with a meal.   tamsulosin 0.4 MG Caps capsule Commonly known as: FLOMAX Take 1 capsule (0.4 mg total) by mouth daily.        Allergies: No Known Allergies  Family History: No family history on file.  Social History:  reports that he does not use drugs. No history on file for tobacco use and alcohol use.  ROS: Pertinent ROS in HPI  Physical Exam: There were no vitals taken for this visit.  Constitutional:  Well nourished. Alert and oriented, No acute distress. HEENT:  AT, moist mucus membranes.  Trachea midline, no masses. Cardiovascular: No clubbing, cyanosis, or edema. Respiratory: Normal respiratory effort,  no increased work of breathing. GI: Abdomen is soft, non tender, non distended, no abdominal masses. Liver and spleen not palpable.  No hernias appreciated.  Stool sample for occult testing is not indicated.   GU: No CVA tenderness.  No bladder fullness or masses.  Patient with circumcised/uncircumcised phallus. ***Foreskin easily retracted***  Urethral meatus is patent.  No penile discharge. No penile lesions or rashes. Scrotum without lesions, cysts, rashes and/or edema.  Testicles are located scrotally bilaterally. No masses are appreciated in the testicles. Left and right epididymis are normal. Rectal: Patient with  normal sphincter  tone. Anus and perineum without scarring or rashes. No rectal masses are appreciated. Prostate is approximately *** grams, *** nodules are appreciated. Seminal vesicles are normal. Skin: No rashes, bruises or suspicious lesions. Lymph: No cervical or inguinal adenopathy. Neurologic: Grossly intact, no focal deficits, moving all 4 extremities. Psychiatric: Normal mood and affect.  Laboratory Data: See HPI and Epic I have reviewed the labs.   Pertinent Imaging: KUB ***, radiologist interpretation still pending I have independently reviewed the films.  See HPI.     Assessment & Plan:  ***  1. Left ureteral stones - UA *** - urine culture pending - KUB *** - proceed with ESWL on 10/16 *** - Explained to the patient that with ESWL, shock waves are focused on the stone using X-rays to pinpoint the stone.  The shock waves are fired repeatedly which usually causes the stone to break into small pieces which pass out in the urine over the next few weeks.  They will go home that day and may be able to resume normal activities in three days.  They should be given a strainer and they need to collect any fragments/sediments that they pass for analysis.   Risks involved with the procedure consist of bruising of the skin and kidney, possible long-term kidney damage, development of HTN, damage to the bowel, spleen, liver, pancreas, male organs or lungs, hematuria, hematuria serious enough to require transfusion or surgical repair, formation of a hematoma, infection or sepsis.  If the stone is too large or dense, it may not break apart or break apart in large pieces and cause Steinstrasse.  If this happens, it would result in the need for another procedure (likely URS/LL/stent placement).  IV sedation is typically used, but in rare instances general anesthesia is used with the risk of irregular heart beat, irregular BP, stroke, MI, CVA, paralysis, coma and/or death.   2. Bilateral stones - seen on CT  (02/2024)  - continue to monitor    No follow-ups on file.  These notes generated with voice recognition software. I apologize for typographical errors.  Tanner Schaefer  Tanner Schaefer [address]  [address] [place], [place] [address] [phone]

## 2024-03-31 ENCOUNTER — Ambulatory Visit
Admission: RE | Admit: 2024-03-31 | Discharge: 2024-03-31 | Disposition: A | Discharge status: Home / Self Care | Attending: Urology | Admitting: Urology

## 2024-03-31 ENCOUNTER — Other Ambulatory Visit: Payer: Self-pay

## 2024-03-31 ENCOUNTER — Encounter: Payer: Self-pay | Admitting: Urology

## 2024-03-31 ENCOUNTER — Ambulatory Visit: Admitting: Urology

## 2024-03-31 ENCOUNTER — Telehealth: Payer: Self-pay

## 2024-03-31 ENCOUNTER — Other Ambulatory Visit
Admission: RE | Admit: 2024-03-31 | Discharge: 2024-03-31 | Disposition: A | Source: Home / Self Care | Attending: Urology | Admitting: Urology

## 2024-03-31 ENCOUNTER — Ambulatory Visit
Admission: RE | Admit: 2024-03-31 | Discharge: 2024-03-31 | Disposition: A | Discharge status: Home / Self Care | Source: Ambulatory Visit | Attending: Urology | Admitting: Urology

## 2024-03-31 VITALS — BP 145/89 | HR 65 | Wt 215.0 lb

## 2024-03-31 DIAGNOSIS — N200 Calculus of kidney: Secondary | ICD-10-CM

## 2024-03-31 DIAGNOSIS — N201 Calculus of ureter: Secondary | ICD-10-CM

## 2024-03-31 LAB — URINALYSIS, COMPLETE (UACMP) WITH MICROSCOPIC
Bacteria, UA: NONE SEEN
Bilirubin Urine: NEGATIVE
Glucose, UA: NEGATIVE mg/dL
Ketones, ur: NEGATIVE mg/dL
Leukocytes,Ua: NEGATIVE
Nitrite: NEGATIVE
Protein, ur: NEGATIVE mg/dL
Specific Gravity, Urine: <1.005 — ABNORMAL LOW (ref 1.005–1.030)
Squamous Epithelial / HPF: NONE SEEN /HPF (ref 0–5)
WBC, UA: NONE SEEN WBC/hpf (ref 0–5)
pH: 6 (ref 5.0–8.0)

## 2024-03-31 NOTE — Telephone Encounter (Signed)
 Received information that patient would like to try passing his stone first- removed from surgery schedule for Thursday 10/16. Will notify [facility] of cancellation.

## 2024-04-01 LAB — URINE CULTURE: Culture: NO GROWTH

## 2024-04-03 ENCOUNTER — Ambulatory Visit: Admit: 2024-04-03 | Admitting: Urology

## 2024-04-03 SURGERY — LITHOTRIPSY, ESWL
Anesthesia: Moderate Sedation | Laterality: Left

## 2024-04-04 ENCOUNTER — Encounter

## 2024-04-06 NOTE — Progress Notes (Unsigned)
 04/07/2024 8:43 AM   Tanner Schaefer 1966-01-24 [id number]  Referring provider: Sadie Manna, MD [address] [facility] [place],  [place] [address]  Urological history: 1. Nephrolithiasis - remote history of ESWL x 3  Chief Complaint  Patient presents with   Nephrolithiasis   Follow-up   HPI: Tanner Schaefer is a 58 y.o. man who presents today for 1 week follow up after MET for a 5 mm stone in the left mid ureter.    Previous records reviewed.  At his visit on 03/31/2024, he was seen 2 weeks after his was seen by Dr. Francisca and decided to pursue MET for a 5 mm left proximal ureteral stone.  UA was unremarkable.  Urine culture was negative.  Urine culture was KUB at that time showed the stone had migrated down to the sacral alla.    Has not passed any fragments or last week.  He also has not had any pain.  Patient denies any modifying or aggravating factors.  Patient denies any recent UTI's, gross hematuria, dysuria or suprapubic/flank pain.  Patient denies any fevers, chills, nausea or vomiting.    KUB the stone still appears at the sacral alla.  PMH: Past Medical History:  Diagnosis Date   Kidney stone     Surgical History: No past surgical history on file.  Home Medications:  Allergies as of 04/07/2024   No Known Allergies      Medication List        Accurate as of April 07, 2024  8:43 AM. If you have any questions, ask your nurse or doctor.          aspirin 81 MG chewable tablet Chew 81 mg by mouth daily.   atenolol 25 MG tablet Commonly known as: TENORMIN Take 25 mg by mouth daily.   Januvia 100 MG tablet Generic drug: sitaGLIPtin Take 100 mg by mouth daily.   ketorolac 10 MG tablet Commonly known as: TORADOL Take 1 tablet (10 mg total) by mouth every 6 (six) hours as needed.   losartan 100 MG tablet Commonly known as: COZAAR Take 100 mg by mouth daily.   metFORMIN 500 MG tablet Commonly known as:  GLUCOPHAGE Take 500 mg by mouth 2 (two) times daily with a meal.   tamsulosin 0.4 MG Caps capsule Commonly known as: FLOMAX Take 1 capsule (0.4 mg total) by mouth daily.        Allergies: No Known Allergies  Family History: No family history on file.  Social History:  reports that he does not use drugs. No history on file for tobacco use and alcohol use.  ROS: Pertinent ROS in HPI  Physical Exam: BP (!) 149/90 (BP Location: Left Arm, Patient Position: Sitting, Cuff Size: Large)   Pulse 61   Wt 215 lb (97.5 kg)   SpO2 98%   BMI 33.67 kg/m   Constitutional:  Well nourished. Alert and oriented, No acute distress. HEENT: [place] AT, moist mucus membranes.  Trachea midline Cardiovascular: No clubbing, cyanosis, or edema. Respiratory: Normal respiratory effort, no increased work of breathing. Neurologic: Grossly intact, no focal deficits, moving all 4 extremities. Psychiatric: Normal mood and affect.   Laboratory Data: See HPI and Epic I have reviewed the labs.   Pertinent Imaging: KUB, radiologist interpretation still pending I have independently reviewed the films.  See HPI.     Assessment & Plan:    1. Left ureteral stones - stone appears to be still located at the  left sacral alla, he will continue to take tamsulosin and push fluids - He will notify us  if he should pass the fragment or have a return of pain  2. Bilateral stones - seen on CT (02/2024)  - continue to monitor    Return in about 2 weeks (around 04/21/2024) for KUB and office visit .  These notes generated with voice recognition software. I apologize for typographical errors.  [name]  [facility] [address]  [address] [place], [place] [address] [phone]

## 2024-04-07 ENCOUNTER — Encounter: Payer: Self-pay | Admitting: Urology

## 2024-04-07 ENCOUNTER — Ambulatory Visit
Admission: RE | Admit: 2024-04-07 | Discharge: 2024-04-07 | Disposition: A | Discharge status: Home / Self Care | Source: Ambulatory Visit | Attending: Urology | Admitting: Urology

## 2024-04-07 ENCOUNTER — Ambulatory Visit
Admission: RE | Admit: 2024-04-07 | Discharge: 2024-04-07 | Disposition: A | Discharge status: Home / Self Care | Attending: Urology | Admitting: Urology

## 2024-04-07 ENCOUNTER — Ambulatory Visit: Admitting: Urology

## 2024-04-07 VITALS — BP 149/90 | HR 61 | Wt 215.0 lb

## 2024-04-07 DIAGNOSIS — N201 Calculus of ureter: Secondary | ICD-10-CM | POA: Diagnosis not present

## 2024-04-07 DIAGNOSIS — N200 Calculus of kidney: Secondary | ICD-10-CM

## 2024-04-16 NOTE — Progress Notes (Signed)
 Chief Complaint  Patient presents with  . Follow-up    HPI  Tanner Schaefer is a 58 y.o. here for a Follow up Has been seeing Urologist for Kidney and Left ureteral stone On Tamsulosin and has been drinking plenty of fluids Denies chest pains or  Shortness of breath. Stays active. Non Smoker. Occasional alcohol. No symptoms of Depression  Recent labs; Hgb; 16.6, Sugar; 158, A1c; 7.0,Se Creat; 1.2,Total cholesterol; 180, Triglycerides; 108 TSH: 1.759 Se Uric acid; 8.1  PSA; 2.15    ROS Rest of 10 point review of systems is normal.  Outpatient Encounter Medications as of 04/16/2024  Medication Sig Dispense Refill  . aspirin 81 MG chewable tablet Take 81 mg by mouth daily.    SABRA atenoloL (TENORMIN) 25 MG tablet TAKE 1 TABLET(25 MG) BY MOUTH DAILY 90 tablet 1  . blood glucose diagnostic (ONETOUCH ULTRA BLUE TEST STRIP) test strip once daily Use as instructed. 100 each 5  . colchicine (COLCRYS) 0.6 mg tablet Take 2 tablets (1.2 mg) at onset of gout flare. Take 1 additional tablet (0.6 mg) one hour later if symptoms persist. Maximum dose is 1.8 mg (3 tablets) in 24 hours. Then take one tablet (0.6 mg) daily until symptoms resolve. 12 tablet 1  . JANUVIA 100 mg tablet TAKE 1 TABLET(100 MG) BY MOUTH DAILY 90 tablet 1  . lancets (ONETOUCH DELICA LANCETS) once daily Use as instructed. 100 each 5  . losartan (COZAAR) 100 MG tablet Take 1 tablet (100 mg total) by mouth once daily 90 tablet 1  . metFORMIN (GLUCOPHAGE) 500 MG tablet TAKE 1 TABLET(500 MG) BY MOUTH TWICE DAILY WITH MEALS 180 tablet 1  . sodium, potassium, and magnesium (SUPREP) oral solution Take 1 Bottle by mouth as directed One kit contains 2 bottles.  Take both bottles at the times instructed by your provider. (Patient not taking: Reported on 04/16/2024) 354 mL 0   No facility-administered encounter medications on file as of 04/16/2024.    Allergies as of 04/16/2024  . (No Known Allergies)    Past Medical History:  Diagnosis  Date  . BCC (basal cell carcinoma), face 2014  . History of kidney stones   . Hypertension   . Type 2 diabetes mellitus (CMS/HHS-HCC)     Past Surgical History:  Procedure Laterality Date  . LITHOTRIPSY  2008  . COLONOSCOPY  08/22/2018   Dr. FABIENE Holmes @ [facility]. Lg. Adenomatous Polyps:  (6 mth rpt) Recall ltr mailed   . COLONOSCOPY  07/18/2019   Tubular adenoma of the colon/Tubulovillous adenoma/Repeat 7yrs/TKT   Vitals:   04/16/24 0818  BP: 136/86  Pulse: 63      Body mass index is 33.99 kg/m.   Appointment on 04/09/2024  Component Date Value Ref Range Status  . CCP Antibodies IgG/IgA - [facility] 04/09/2024 8  0 - 19 units Final                             Negative               <20                           Weak positive      20 - 39                           Moderate positive  40 - 59                           Strong positive        >59  . WBC (White Blood Cell Count) 04/09/2024 7.6  4.1 - 10.2 10^3/uL Final  . RBC (Red Blood Cell Count) 04/09/2024 5.19  4.69 - 6.13 10^6/uL Final  . Hemoglobin 04/09/2024 16.6  14.1 - 18.1 gm/dL Final  . Hematocrit 89/77/7974 47.3  40.0 - 52.0 % Final  . MCV (Mean Corpuscular Volume) 04/09/2024 91.1  80.0 - 100.0 fl Final  . MCH (Mean Corpuscular Hemoglobin) 04/09/2024 32.0 (H)  27.0 - 31.2 pg Final  . MCHC (Mean Corpuscular Hemoglobin * 04/09/2024 35.1  32.0 - 36.0 gm/dL Final  . Platelet Count 04/09/2024 235  150 - 450 10^3/uL Final  . RDW-CV (Red Cell Distribution Widt* 04/09/2024 12.1  11.6 - 14.8 % Final  . MPV (Mean Platelet Volume) 04/09/2024 11.1  9.4 - 12.4 fl Final  . Neutrophils 04/09/2024 4.13  1.50 - 7.80 10^3/uL Final  . Lymphocytes 04/09/2024 2.37  1.00 - 3.60 10^3/uL Final  . Monocytes 04/09/2024 0.70  0.00 - 1.50 10^3/uL Final  . Eosinophils 04/09/2024 0.25  0.00 - 0.55 10^3/uL Final  . Basophils 04/09/2024 0.06  0.00 - 0.09 10^3/uL Final  . Neutrophil % 04/09/2024 54.7  32.0 - 70.0 % Final  . Lymphocyte %  04/09/2024 31.4  10.0 - 50.0 % Final  . Monocyte % 04/09/2024 9.3  4.0 - 13.0 % Final  . Eosinophil % 04/09/2024 3.3  1.0 - 5.0 % Final  . Basophil% 04/09/2024 0.8  0.0 - 2.0 % Final  . Immature Granulocyte % 04/09/2024 0.5  <=0.7 % Final  . Immature Granulocyte Count 04/09/2024 0.04  <=0.06 10^3/L Final  . Glucose 04/09/2024 158 (H)  70 - 110 mg/dL Final  . Sodium 89/77/7974 142  136 - 145 mmol/L Final  . Potassium 04/09/2024 5.0  3.6 - 5.1 mmol/L Final  . Chloride 04/09/2024 103  97 - 109 mmol/L Final  . Carbon Dioxide (CO2) 04/09/2024 31.5  22.0 - 32.0 mmol/L Final  . Urea Nitrogen (BUN) 04/09/2024 19  7 - 25 mg/dL Final  . Creatinine 89/77/7974 1.2  0.7 - 1.3 mg/dL Final  . Glomerular Filtration Rate (eGFR) 04/09/2024 71  >60 mL/min/1.73sq m Final   CKD-EPI (2021) does not include patient's race in the calculation of eGFR.  Monitoring changes of plasma creatinine and eGFR over time is useful for monitoring kidney function.   Interpretive Ranges for eGFR (CKD-EPI 2021):  eGFR:       >60 mL/min/1.73 sq. m - Normal eGFR:       30-59 mL/min/1.73 sq. m - Moderately Decreased eGFR:       15-29 mL/min/1.73 sq. m  - Severely Decreased eGFR:       < 15 mL/min/1.73 sq. m  - Kidney Failure    Note: These eGFR calculations do not apply in acute situations when eGFR is changing rapidly or patients on dialysis.  . Calcium 04/09/2024 9.8  8.7 - 10.3 mg/dL Final  . AST  89/77/7974 22  8 - 39 U/L Final  . ALT  04/09/2024 17  6 - 57 U/L Final  . Alk Phos (alkaline Phosphatase) 04/09/2024 56  34 - 104 U/L Final  . Albumin 04/09/2024 4.4  3.5 - 4.8 g/dL Final  . Bilirubin, Total 04/09/2024 1.6 (H)  0.3 - 1.2 mg/dL Final  .  Protein, Total 04/09/2024 7.0  6.1 - 7.9 g/dL Final  . A/G Ratio 89/77/7974 1.7  1.0 - 5.0 gm/dL Final  . Hemoglobin J8R 04/09/2024 7.0 (H)  4.2 - 5.6 % Final  . Average Blood Glucose (Calc) 04/09/2024 154  mg/dL Final  . Cholesterol, Total 04/09/2024 180  100 - 200 mg/dL Final   . Triglyceride 04/09/2024 108  35 - 199 mg/dL Final  . HDL (High Density Lipoprotein) Cho* 04/09/2024 50.1  29.0 - 71.0 mg/dL Final  . LDL Calculated 04/09/2024 891  0 - 130 mg/dL Final  . VLDL Cholesterol 04/09/2024 22  mg/dL Final  . Cholesterol/HDL Ratio 04/09/2024 3.6   Final  . Creatinine, Random Urine 04/09/2024 59.0  40.0 - 300.0 mg/dL Final  . Urine Albumin, Random 04/09/2024 <7    mg/L Final  . Urine Albumin/Creatinine Ratio 04/09/2024 <11.9  <30.0 ug/mg Final   Urine:         Spot collection              (g/mg creatinine)     Normal               < 30   Moderately          30-299         increased   Clinical             >=300 albuminuria  . Thyroid Stimulating Hormone (TSH) 04/09/2024 1.759  0.450-5.330 uIU/ml uIU/mL Final  . Color 04/09/2024 Colorless  Colorless, Straw, Light Yellow, Yellow, Dark Yellow Final  . Clarity 04/09/2024 Clear  Clear Final  . Specific Gravity 04/09/2024 1.009  1.005 - 1.030 Final  . pH, Urine 04/09/2024 6.0  5.0 - 8.0 Final  . Protein, Urinalysis 04/09/2024 Negative  Negative mg/dL Final  . Glucose, Urinalysis 04/09/2024 Negative  Negative mg/dL Final  . Ketones, Urinalysis 04/09/2024 Negative  Negative mg/dL Final  . Blood, Urinalysis 04/09/2024 Negative  Negative Final  . Nitrite, Urinalysis 04/09/2024 Negative  Negative Final  . Leukocyte Esterase, Urinalysis 04/09/2024 Negative  Negative Final  . Bilirubin, Urinalysis 04/09/2024 Negative  Negative Final  . Urobilinogen, Urinalysis 04/09/2024 0.2  0.2 - 1.0 mg/dL Final  . WBC, UA 89/77/7974 1  <=5 /hpf Final  . Red Blood Cells, Urinalysis 04/09/2024 1  <=3 /hpf Final  . Bacteria, Urinalysis 04/09/2024 0-5  0 - 5 /hpf Final  . Squamous Epithelial Cells, Urinaly* 04/09/2024 0  /hpf Final  . Uric Acid 04/09/2024 8.1 (H)  4.4 - 7.6 mg/dL Final  Same Day Visit on 03/12/2024  Component Date Value Ref Range Status  . Urine Culture, Routine - [facility] 03/12/2024 Final report   Final  .  Result 1 - [facility] 03/12/2024 Comment   Final   Culture shows less than 10,000 colony forming units of bacteria per milliliter of urine. This colony count is not generally considered to be clinically significant.  . Color 03/12/2024 Yellow  Colorless, Straw, Light Yellow, Yellow, Dark Yellow Final  . Clarity 03/12/2024 Cloudy (!)  Clear Final  . Specific Gravity 03/12/2024 1.020  1.005 - 1.030 Final  . pH, Urine 03/12/2024 5.0  5.0 - 8.0 Final  . Protein, Urinalysis 03/12/2024 Trace (!)  Negative mg/dL Final  . Glucose, Urinalysis 03/12/2024 Negative  Negative mg/dL Final  . Ketones, Urinalysis 03/12/2024 1+ (!)  Negative mg/dL Final  . Blood, Urinalysis 03/12/2024 3+ (!)  Negative Final  . Nitrite, Urinalysis 03/12/2024 Negative  Negative Final  . Leukocyte Esterase, Urinalysis 03/12/2024  Negative  Negative Final  . Bilirubin, Urinalysis 03/12/2024 Negative  Negative Final  . Urobilinogen, Urinalysis 03/12/2024 0.2  0.2 - 1.0 mg/dL Final  . WBC, UA 90/75/7974 <1  <=5 /hpf Final  . Red Blood Cells, Urinalysis 03/12/2024 >182 (H)  <=3 /hpf Final  . Bacteria, Urinalysis 03/12/2024 0-5  0 - 5 /hpf Final  . Squamous Epithelial Cells, Urinaly* 03/12/2024 0  /hpf Final    Exam    Blood pressure 136/86, pulse 63, height 170.2 cm (5' 7), weight 98.4 kg (217 lb), SpO2 98%.    Body mass index is 33.99 kg/m. Wt Readings from Last 3 Encounters:  04/16/24 98.4 kg (217 lb)  03/12/24 95.4 kg (210 lb 6.4 oz)  11/05/23 96.2 kg (212 lb)   Body mass index is 33.99 kg/m.  Overweight General. Alert oriented x3  Skin. No suspicious lesions or moles.   Eyes. Sclera and conjunctiva clear; pupils equal round and reactive to light ; extraocular movements intact Ears. External normal; canals clear; tympanic membranes normal Nose. Mucosa healthy without drainage or ulceration Oropharynx. No suspicious lesions Neck. No swelling, masses, stiffness, pain, limited movement, carotid pulses normal  bilaterally, thyroid normal size, no masses palpated.  No bruits Lungs. Respirations unlabored; clear to auscultation bilaterally Back. No spinal deformity Cardiovascular. Heart regular rate and rhythm without murmurs, gallops, or rubs Abdomen. Soft; non tender; non distended; normoactive bowel sounds; no masses or organomegaly RECTAL: Declined  Lymph Nodes. No significant cervical, supraclavicular, axillary or inguinal lymphadenopathy noted Musculoskeletal. No deformities; no active joint inflammation Extremities. Normal, no edema Neurologic. Alert and oriented; speech intact; face symmetrical; moves all extremities well   Assessment and Plan   1 HTN- Stable- On Atenolol 25 milligrams po daily and Losartan 100 mg po every day  2 Type 2 DM- On Metformin 500 mg po qd  and Januvia  A1c is 7.0 Increase Metformin to 500 mg po twice a day  Discussed Low carb diet  3 Anxiety and stress-Doing ok- Takes  Xanax rarely 4 Positive F/h of ruptured AAA: U/s of abdomen -Ok Feb 2020 U.s of abdomen was ok 5 Decreased urinary stream; Takes Flomax as needed 6 Positive Rheumatoid factor; Patient is relatively asymptomatic Hold off on Rheumatology referral at this time  7 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; Rec; Allopurinol 100 mg po qd  8 Low back pain; Likely lumbar strain; Prescription sent for Cyclobenzaprine 5 mg po twice a day prn  9 Health Maintenance: Up to date with Flu shot ,Pneumovax and COVID vaccine  Continue efforts at weight reduction PSA-Normal (1.57) Colonoscopy ; March 2020- 10 polyps - Tubular adenoma Repeated in Jan 2021: 4 polyps, Non bleeding Internal hemorrhoids Will have Colonoscopy soon  Discussed results of labs Up to date with  Flu shot (Today),Inj Tdap and COVID 19 vaccine  Check lipids, Met-c A1c 1 week prior to next visit  Follow up in 6 months    Tamra Leventhal  MD

## 2024-04-17 ENCOUNTER — Other Ambulatory Visit: Payer: Self-pay | Admitting: Urology

## 2024-04-17 NOTE — Progress Notes (Signed)
 04/21/2024 8:46 AM   Tanner Schaefer [birth year] [id number]  Referring provider: Sadie Manna, MD [address] [facility] [place],  [place] [address]  Urological history: 1. Nephrolithiasis - remote history of ESWL x 3  Chief Complaint  Patient presents with   Follow-up   Nephrolithiasis   HPI: Tanner Schaefer is a 58 y.o. man who presents today for 2 week follow up after MET for a 5 mm stone in the left mid ureter.    Previous records reviewed.  He has had a quiet week.  He has not had no gross hematuria, no passage of fragment and no flank pain.  He does have some urinary frequency and urgency at times.  Patient denies any modifying or aggravating factors.  Patient denies any recent UTI's, gross hematuria, dysuria or suprapubic/flank pain.  Patient denies any fevers, chills, nausea or vomiting.    KUB shows the stone has migrated past the left sacral alla into the pelvic brim  UA straw clear, specific gravity 1.010, pH 5.0, moderate heme, 6-10 RBCs, 0-5 WBCs and 0 squame present along with mucus  PMH: Past Medical History:  Diagnosis Date   Kidney stone     Surgical History: No past surgical history on file.  Home Medications:  Allergies as of 04/21/2024   No Known Allergies      Medication List        Accurate as of April 21, 2024  8:46 AM. If you have any questions, ask your nurse or doctor.          allopurinol 100 MG tablet Commonly known as: ZYLOPRIM Take 100 mg by mouth.   aspirin 81 MG chewable tablet Chew 81 mg by mouth daily.   atenolol 25 MG tablet Commonly known as: TENORMIN Take 25 mg by mouth daily.   cyclobenzaprine 5 MG tablet Commonly known as: FLEXERIL Take 5 mg by mouth.   Januvia 100 MG tablet Generic drug: sitaGLIPtin Take 100 mg by mouth daily.   ketorolac 10 MG tablet Commonly known as: TORADOL Take 1 tablet (10 mg total) by mouth every 6 (six) hours as needed.   losartan 100 MG  tablet Commonly known as: COZAAR Take 100 mg by mouth daily.   metFORMIN 500 MG tablet Commonly known as: GLUCOPHAGE Take 500 mg by mouth 2 (two) times daily with a meal.   tamsulosin 0.4 MG Caps capsule Commonly known as: FLOMAX TAKE 1 CAPSULE(0.4 MG) BY MOUTH DAILY        Allergies: No Known Allergies  Family History: No family history on file.  Social History:  reports that he does not use drugs. No history on file for tobacco use and alcohol use.  ROS: Pertinent ROS in HPI  Physical Exam: BP (!) 142/85 (BP Location: Left Arm, Patient Position: Sitting, Cuff Size: Normal)   Pulse 66   Wt 215 lb (97.5 kg)   SpO2 97%   BMI 33.67 kg/m   Constitutional:  Well nourished. Alert and oriented, No acute distress. HEENT: [place] AT, moist mucus membranes.  Trachea midline Cardiovascular: No clubbing, cyanosis, or edema. Respiratory: Normal respiratory effort, no increased work of breathing. Neurologic: Grossly intact, no focal deficits, moving all 4 extremities. Psychiatric: Normal mood and affect.   Laboratory Data: See HPI and Epic I have reviewed the labs.   Pertinent Imaging: KUB, radiologist interpretation still pending I have independently reviewed the films.  See HPI.     Assessment & Plan:  1. Left ureteral stones - The stone has migrated past the pelvic brim on the left, he is leaving to go on vacation the Tuesday before Thanksgiving, so the week before Thanksgiving he will come in on Monday for KUB and if the stone is still present we will proceed with ESWL that Thursday  2. Bilateral stones - seen on CT (02/2024)  - continue to monitor    Return for 11/17 for KUB and office visit .  These notes generated with voice recognition software. I apologize for typographical errors.  Tanner Schaefer  [facility] [address]  [address] [place], [place] [address] [phone]

## 2024-04-21 ENCOUNTER — Ambulatory Visit
Admission: RE | Admit: 2024-04-21 | Discharge: 2024-04-21 | Disposition: A | Discharge status: Home / Self Care | Attending: Urology | Admitting: Urology

## 2024-04-21 ENCOUNTER — Ambulatory Visit
Admission: RE | Admit: 2024-04-21 | Discharge: 2024-04-21 | Disposition: A | Source: Ambulatory Visit | Attending: Urology

## 2024-04-21 ENCOUNTER — Encounter: Payer: Self-pay | Admitting: Urology

## 2024-04-21 ENCOUNTER — Other Ambulatory Visit: Payer: Self-pay

## 2024-04-21 ENCOUNTER — Ambulatory Visit: Admitting: Urology

## 2024-04-21 ENCOUNTER — Other Ambulatory Visit
Admission: RE | Admit: 2024-04-21 | Discharge: 2024-04-21 | Disposition: A | Discharge status: Home / Self Care | Source: Home / Self Care | Attending: Urology | Admitting: Urology

## 2024-04-21 VITALS — BP 142/85 | HR 66 | Wt 215.0 lb

## 2024-04-21 DIAGNOSIS — N200 Calculus of kidney: Secondary | ICD-10-CM

## 2024-04-21 DIAGNOSIS — N201 Calculus of ureter: Secondary | ICD-10-CM

## 2024-04-21 LAB — URINALYSIS, COMPLETE (UACMP) WITH MICROSCOPIC
Bacteria, UA: NONE SEEN
Bilirubin Urine: NEGATIVE
Glucose, UA: NEGATIVE mg/dL
Ketones, ur: NEGATIVE mg/dL
Leukocytes,Ua: NEGATIVE
Nitrite: NEGATIVE
Protein, ur: NEGATIVE mg/dL
Specific Gravity, Urine: 1.01 (ref 1.005–1.030)
Squamous Epithelial / HPF: 0 /HPF (ref 0–5)
pH: 5 (ref 5.0–8.0)

## 2024-04-22 LAB — URINE CULTURE: Culture: <10000 — AB

## 2024-05-01 NOTE — Progress Notes (Signed)
 05/05/2024 8:44 AM   Tanner Schaefer [birth year] [id number]  Referring provider: Sadie Manna, MD [address] [facility] Tanner Schaefer,  [place] [address]  Urological history: 1. Nephrolithiasis - remote history of ESWL x 3  Chief Complaint  Patient presents with   Nephrolithiasis   HPI: Tanner Schaefer is a 58 y.o. man who presents today for 2 week follow up after MET for a 5 mm stone in the left distal ureter.    Previous records reviewed.  He has not had any flank pain, but he did increase his water intake, started taking the Flomax and did a lot of yard work including mowing the lawn to jumble his body.  He has had some urgency and frequency.  Patient denies any modifying or aggravating factors.  Patient denies any recent UTI's, gross hematuria, dysuria or suprapubic/flank pain.  Patient denies any fevers, chills, nausea or vomiting.    KUB shows the stone has migrated to the left UVJ  PMH: Past Medical History:  Diagnosis Date   Kidney stone     Surgical History: No past surgical history on file.  Home Medications:  Allergies as of 05/05/2024   No Known Allergies      Medication List        Accurate as of May 05, 2024  8:44 AM. If you have any questions, ask your nurse or doctor.          allopurinol 100 MG tablet Commonly known as: ZYLOPRIM Take 100 mg by mouth.   aspirin 81 MG chewable tablet Chew 81 mg by mouth daily.   atenolol 25 MG tablet Commonly known as: TENORMIN Take 25 mg by mouth daily.   cyclobenzaprine 5 MG tablet Commonly known as: FLEXERIL Take 5 mg by mouth.   Januvia 100 MG tablet Generic drug: sitaGLIPtin Take 100 mg by mouth daily.   ketorolac 10 MG tablet Commonly known as: TORADOL Take 1 tablet (10 mg total) by mouth every 6 (six) hours as needed.   losartan 100 MG tablet Commonly known as: COZAAR Take 100 mg by mouth daily.   metFORMIN 500 MG tablet Commonly known as: GLUCOPHAGE Take  500 mg by mouth 2 (two) times daily with a meal.   tamsulosin 0.4 MG Caps capsule Commonly known as: FLOMAX TAKE 1 CAPSULE(0.4 MG) BY MOUTH DAILY        Allergies: No Known Allergies  Family History: No family history on file.  Social History:  reports that he does not use drugs. No history on file for tobacco use and alcohol use.  ROS: Pertinent ROS in HPI  Physical Exam: BP (!) 147/75 (BP Location: Left Arm, Patient Position: Sitting, Cuff Size: Large)   Pulse 60   Wt 215 lb (97.5 kg)   SpO2 97%   BMI 33.67 kg/m   Constitutional:  Well nourished. Alert and oriented, No acute distress. HEENT: [place] AT, moist mucus membranes.  Trachea midline Cardiovascular: No clubbing, cyanosis, or edema. Respiratory: Normal respiratory effort, no increased work of breathing. Neurologic: Grossly intact, no focal deficits, moving all 4 extremities. Psychiatric: Normal mood and affect.   Laboratory Data: See HPI and Epic I have reviewed the labs.   Pertinent Imaging: KUB, radiologist interpretation still pending I have independently reviewed the films.  See HPI.     Assessment & Plan:    1. Left ureteral stones - The stone has now migrated to the left UVJ - He will increase the tamsulosin 0.4 mg  twice daily - We will go ahead and schedule ESWL - Explained to the patient that with ESWL, shock waves are focused on the stone using X-rays to pinpoint the stone.  The shock waves are fired repeatedly which usually causes the stone to break into small pieces which pass out in the urine over the next few weeks.  They will go home that day and may be able to resume normal activities in three days.  They should be given a strainer and they need to collect any fragments/sediments that they pass for analysis.   Risks involved with the procedure consist of bruising of the skin and kidney, possible long-term kidney damage, development of HTN, damage to the bowel, spleen, liver, pancreas, male  organs or lungs, hematuria, hematuria serious enough to require transfusion or surgical repair, formation of a hematoma, infection or sepsis.  If the stone is too large or dense, it may not break apart or break apart in large pieces and cause Steinstrasse.  If this happens, it would result in the need for another procedure (likely URS/LL/stent placement).  IV sedation is typically used, but in rare instances general anesthesia is used with the risk of irregular heart beat, irregular BP, stroke, MI, CVA, paralysis, coma and/or death.  -He is going out of town for Thanksgiving week, so would like to have more time for MET and not pursue ureteroscopy as we do not have time on the truck this Thursday - He will have a KUB on December 1 to confirm position of the stone in order to confirm that he has passed it and we will pursue as well on December 4  2. Bilateral stones - seen on CT (02/2024)  - continue to monitor   Return for left ESWL 12/04.  These notes generated with voice recognition software. I apologize for typographical errors.  Tanner Schaefer  [facility] [address]  [address] [place], [place] [address] [phone]    ESWL ORDER FORM  Expected date of procedure: 05/08/2024  Surgeon: TBD  Post op standing: 2-4wk follow up w/KUB prior  Anticoagulation/Aspirin/NSAID standing order: Hold all 72 hours prior  Anesthesia standing order: MAC  VTE standing: SCD's  Dx: Left Ureteral Stone  Procedure: left Extracorporeal shock wave lithotripsy  CPT : 50590  Standing Order Set:   *NPO after mn, KUB  *NS 114ml/hr, Keflex 500mg  PO, Benadryl 25mg  PO, Valium 10mg  PO, Zofran 4mg  IV  Medications if other than standing orders:   NONE

## 2024-05-05 ENCOUNTER — Encounter: Payer: Self-pay | Admitting: Urology

## 2024-05-05 ENCOUNTER — Other Ambulatory Visit: Payer: Self-pay

## 2024-05-05 ENCOUNTER — Ambulatory Visit
Admission: RE | Admit: 2024-05-05 | Discharge: 2024-05-05 | Disposition: A | Source: Ambulatory Visit | Attending: Urology

## 2024-05-05 ENCOUNTER — Ambulatory Visit: Admitting: Urology

## 2024-05-05 ENCOUNTER — Ambulatory Visit
Admission: RE | Admit: 2024-05-05 | Discharge: 2024-05-05 | Disposition: A | Discharge status: Home / Self Care | Attending: Urology | Admitting: Urology

## 2024-05-05 VITALS — BP 147/75 | HR 60 | Wt 215.0 lb

## 2024-05-05 DIAGNOSIS — N201 Calculus of ureter: Secondary | ICD-10-CM

## 2024-05-05 NOTE — Addendum Note (Signed)
 Addended by: Oluwadara Gorman A on: 05/05/2024 08:47 AM   Modules accepted: Orders

## 2024-05-05 NOTE — Progress Notes (Unsigned)
 ESWL ORDER FORM  Expected date of procedure: 05/22/2024  Surgeon:  TBD  Post op standing: 2-4wk follow up w/KUB prior  Anticoagulation/Aspirin/NSAID standing order: Hold all 72 hours prior  Anesthesia standing order: MAC  VTE standing: SCD's  Dx: Left Ureteral Stone  Procedure: left Extracorporeal shock wave lithotripsy  CPT : 50590  Standing Order Set:   *NPO after mn, KUB  *NS 171ml/hr, Keflex 500mg  PO, Benadryl 25mg  PO, Valium 10mg  PO, Zofran 4mg  IV  Medications if other than standing orders:   NONE

## 2024-05-06 NOTE — Progress Notes (Signed)
    [facility] ESWL POSTING SHEET        Patient Name: Tanner Schaefer  DOB: 23-Sep-1965  MRN: [id number]  Surgeon:  Redell Burnet, MD  Diagnosis:  Left Ureteral Stone  CPT: 49409  ESWL DATE: 05/22/2024  ESWL TIME: 0730  Special Needs/Requirements: None       Cardiac/Medical/Pulmonary Clearance needed: no       Form Faxed to [facility]- [phone] Date:   Date: 05/06/24       Form Faxed to [place]- [phone]  Date:  Date: 05/06/24           Copy Made for Insurance PA:  Date: 05/06/24       Orders Entered in to Epic:  Date: 05/06/24

## 2024-05-19 ENCOUNTER — Ambulatory Visit: Admission: RE | Admit: 2024-05-19 | Discharge: 2024-05-19 | Disposition: A | Attending: Urology | Admitting: Urology

## 2024-05-19 ENCOUNTER — Ambulatory Visit
Admission: RE | Admit: 2024-05-19 | Discharge: 2024-05-19 | Disposition: A | Source: Ambulatory Visit | Attending: Urology

## 2024-05-19 DIAGNOSIS — N201 Calculus of ureter: Secondary | ICD-10-CM

## 2024-05-21 MED ORDER — DIAZEPAM 5 MG PO TABS
10.0000 mg | ORAL_TABLET | ORAL | Status: AC
  Administered 2024-05-22: 10 mg via ORAL

## 2024-05-21 MED ORDER — CEPHALEXIN 500 MG PO CAPS
500.0000 mg | ORAL_CAPSULE | Freq: Once | ORAL | Status: AC
  Administered 2024-05-22: 500 mg via ORAL

## 2024-05-21 MED ORDER — DIPHENHYDRAMINE HCL 25 MG PO CAPS
25.0000 mg | ORAL_CAPSULE | ORAL | Status: AC
  Administered 2024-05-22: 25 mg via ORAL

## 2024-05-21 MED ORDER — ONDANSETRON HCL 4 MG/2ML IJ SOLN
4.0000 mg | Freq: Once | INTRAMUSCULAR | Status: AC
  Administered 2024-05-22: 4 mg via INTRAVENOUS

## 2024-05-21 MED ORDER — SODIUM CHLORIDE 0.9 % IV SOLN: INTRAVENOUS | Status: DC

## 2024-05-22 ENCOUNTER — Ambulatory Visit: Admission: RE | Admit: 2024-05-22 | Discharge: 2024-05-22 | Disposition: A | Attending: Urology | Admitting: Urology

## 2024-05-22 ENCOUNTER — Encounter: Payer: Self-pay | Admitting: Urology

## 2024-05-22 ENCOUNTER — Ambulatory Visit

## 2024-05-22 ENCOUNTER — Other Ambulatory Visit: Payer: Self-pay

## 2024-05-22 ENCOUNTER — Encounter: Admission: RE | Disposition: A | Payer: Self-pay | Source: Home / Self Care | Attending: Urology

## 2024-05-22 DIAGNOSIS — N201 Calculus of ureter: Secondary | ICD-10-CM

## 2024-05-22 HISTORY — PX: EXTRACORPOREAL SHOCK WAVE LITHOTRIPSY: SHX1557

## 2024-05-22 LAB — GLUCOSE, CAPILLARY: Glucose-Capillary: 146 mg/dL — ABNORMAL HIGH (ref 70–99)

## 2024-05-22 SURGERY — LITHOTRIPSY, ESWL
Anesthesia: Moderate Sedation | Laterality: Left

## 2024-05-22 MED ORDER — ONDANSETRON HCL 4 MG/2ML IJ SOLN
INTRAMUSCULAR | Status: AC
  Filled 2024-05-22: qty 2

## 2024-05-22 MED ORDER — CEPHALEXIN 500 MG PO CAPS
ORAL_CAPSULE | ORAL | Status: AC
  Filled 2024-05-22: qty 1

## 2024-05-22 MED ORDER — DIAZEPAM 5 MG PO TABS
ORAL_TABLET | ORAL | Status: AC
  Filled 2024-05-22: qty 2

## 2024-05-22 MED ORDER — TAMSULOSIN HCL 0.4 MG PO CAPS: 0.4000 mg | ORAL_CAPSULE | Freq: Every day | ORAL | 1 refills | Status: AC

## 2024-05-22 MED ORDER — DIPHENHYDRAMINE HCL 25 MG PO CAPS
ORAL_CAPSULE | ORAL | Status: AC
  Filled 2024-05-22: qty 1

## 2024-05-22 NOTE — Brief Op Note (Signed)
 05/22/2024  9:25 AM  PATIENT:  Tanner Schaefer  58 y.o. male  PRE-OPERATIVE DIAGNOSIS:  Left Ureteral Stone, 6mm  POST-OPERATIVE DIAGNOSIS:  Same  PROCEDURE:  Procedure(s): LITHOTRIPSY, ESWL (Left)  SURGEON:  Surgeons and Role:    DEWAINE Francisca Redell JAYSON, MD - Primary  ANESTHESIA: Conscious Sedation  EBL:  None  Drains: None  Specimen: None  Findings:  Stone appeared to fragment well on flouroscopy, tolerated SWL well  DISPO: Flomax , pain meds PRN, RTC 2 weeks KUB  Redell Francisca, MD 05/22/2024

## 2024-05-22 NOTE — H&P (Signed)
   05/22/24 7:32 AM   [name] 01-27-1966 [id number]  CC: Left ureteral stone  HPI: 58 year old male who failed trial of medical expulsive therapy for a 5 mm ureteral stone, has now migrated to the distal ureter and he opted for shockwave lithotripsy.  Denies any fevers or chills.   PMH: Past Medical History:  Diagnosis Date   Kidney stone      Family History: History reviewed. No pertinent family history.  Social History:  reports that he does not use drugs. No history on file for tobacco use and alcohol use.  Physical Exam: BP (!) 169/104   Pulse (!) 59   Temp (!) 96.8 F (36 C) (Temporal)   Resp 16   SpO2 96%    Constitutional:  Alert and oriented, No acute distress. Cardiovascular: Regular rate and rhythm Respiratory: Clear to auscultation bilaterally GI: Abdomen is soft, nontender, nondistended, no abdominal masses   Laboratory Data: Urine culture 11/3 no growth   Assessment & Plan:   58 year old male with 5 mm left distal ureteral stone, failed prolonged trial of medical expulsive therapy and opted for definitive management with shockwave lithotripsy.  SWL has a lower stone free rate in a single procedure, but also a lower complication rate compared to ureteroscopy and avoids a stent and associated stent related symptoms. Possible complications include renal hematoma, steinstrasse, and need for additional treatment.   Left shockwave lithotripsy today  Redell Burnet, MD 05/22/2024  [facility] [address] [place], [place] [address] [phone]

## 2024-05-23 ENCOUNTER — Encounter: Payer: Self-pay | Admitting: Urology

## 2024-06-02 NOTE — Progress Notes (Deleted)
 06/09/2024 11:20 AM   Tanner Schaefer 26-May-1966 [id number]  Referring provider: Sadie Manna, MD [address] [facility] [place],  [place] [address]  Urological history 1.  Nephrolithiasis - remote history of ESWL x 3  CC: Nephrolithiasis  HPI: Tanner Schaefer is a 58 y.o. who is status post ESWL who presents today for follow up.  The patient presents for follow-up after undergoing extracorporeal shock wave lithotripsy (ESWL) for a previously identified left renal calculus. He reports doing well overall. No flank pain, hematuria, dysuria, fever, or urinary urgency/frequency. He denies passing any visible stone fragments since the procedure but notes improvement in prior discomfort. ***  KUB ***  UA ***  PMH: Past Medical History:  Diagnosis Date   Kidney stone     Surgical History: Past Surgical History:  Procedure Laterality Date   EXTRACORPOREAL SHOCK WAVE LITHOTRIPSY Left 05/22/2024   Procedure: LITHOTRIPSY, ESWL;  Surgeon: Francisca Redell BROCKS, MD;  Location: [facility] ORS;  Service: Urology;  Laterality: Left;    Home Medications:  Medications Ordered Prior to Encounter[1]  Allergies: Allergies[2]  Family History: No family history on file.  Social History:  reports that he does not use drugs. No history on file for tobacco use and alcohol use.  ROS: Pertinent ROS in HPI  Physical Exam: There were no vitals taken for this visit.  Constitutional:  Well nourished. Alert and oriented, No acute distress. HEENT:  [place] AT, mask in place.   Trachea midline. Cardiovascular: No clubbing, cyanosis, or edema. Respiratory: Normal respiratory effort, no increased work of breathing. Neurologic: Grossly intact, no focal deficits, moving all 4 extremities. Psychiatric: Normal mood and affect.  Laboratory Data: See Epic and HPI   I have reviewed the labs.   Pertinent Imaging: KUB, radiologist interpretation still pending I have independently reviewed the  films.  See HPI.   Assessment: Patient with history of left renal calculus, now status post ESWL with complete radiographic resolution. Clinically asymptomatic. No evidence of residual stone burden or complications.  Plan: - Reassurance provided regarding successful stone clearance. - Encourage ongoing hydration (>=2-2.5 L/day) to reduce recurrence risk. - Discussed dietary stone-prevention strategies tailored to stone type if known (e.g., limiting sodium, moderating animal protein, maintaining normal calcium intake). - Continue routine activity as tolerated. - Return to clinic if symptoms recur (flank pain, hematuria, fever).  - Follow-up PRN or in 6-12 months with repeat imaging if clinically indicated.   No follow-ups on file.  These notes generated with voice recognition software. I apologize for typographical errors.  [name]  [facility] [address]  [address] [place], [place] [address] [phone]      [1]  Current Outpatient Medications on File Prior to Visit  Medication Sig Dispense Refill   allopurinol (ZYLOPRIM) 100 MG tablet Take 100 mg by mouth.     aspirin 81 MG chewable tablet Chew 81 mg by mouth daily.     atenolol (TENORMIN) 25 MG tablet Take 25 mg by mouth daily.     JANUVIA 100 MG tablet Take 100 mg by mouth daily.     ketorolac  (TORADOL ) 10 MG tablet Take 1 tablet (10 mg total) by mouth every 6 (six) hours as needed. 10 tablet 0   losartan (COZAAR) 100 MG tablet Take 100 mg by mouth daily.     metFORMIN (GLUCOPHAGE) 500 MG tablet Take 500 mg by mouth 2 (two) times daily with a meal.     tamsulosin  (FLOMAX ) 0.4  MG CAPS capsule Take 1 capsule (0.4 mg total) by mouth daily after supper. 90 capsule 1   No current facility-administered medications on file prior to visit.  [2] No Known Allergies

## 2024-06-06 DIAGNOSIS — K635 Polyp of colon: Secondary | ICD-10-CM | POA: Diagnosis not present

## 2024-06-06 DIAGNOSIS — Z860101 Personal history of adenomatous and serrated colon polyps: Secondary | ICD-10-CM | POA: Diagnosis not present

## 2024-06-06 DIAGNOSIS — Z09 Encounter for follow-up examination after completed treatment for conditions other than malignant neoplasm: Secondary | ICD-10-CM | POA: Diagnosis present

## 2024-06-06 DIAGNOSIS — K640 First degree hemorrhoids: Secondary | ICD-10-CM | POA: Diagnosis not present

## 2024-06-06 DIAGNOSIS — D123 Benign neoplasm of transverse colon: Secondary | ICD-10-CM | POA: Diagnosis not present

## 2024-06-09 ENCOUNTER — Ambulatory Visit: Admitting: Urology

## 2024-06-16 ENCOUNTER — Ambulatory Visit

## 2024-06-24 NOTE — Progress Notes (Unsigned)
 06/25/2024 4:00 PM   Tanner Schaefer 10-19-1965 [id number]  Referring provider: Sadie Manna, MD [address] [facility] [place],  [place] [address]  Urological history 1.  Nephrolithiasis - remote history of ESWL x 3  CC: Nephrolithiasis  HPI: Tanner Schaefer is a 59 y.o. who is status post ESWL who presents today for follow up.  The patient presents for follow-up after undergoing extracorporeal shock wave lithotripsy (ESWL) for a previously identified left renal calculus. He reports doing well overall. No flank pain, hematuria, dysuria, fever, or urinary urgency/frequency. He denies passing any visible stone fragments since the procedure but notes improvement in prior discomfort.    He brings in stones for analysis.   KUB no stones seen   UA bland  PMH: Past Medical History:  Diagnosis Date   Kidney stone     Surgical History: Past Surgical History:  Procedure Laterality Date   EXTRACORPOREAL SHOCK WAVE LITHOTRIPSY Left 05/22/2024   Procedure: LITHOTRIPSY, ESWL;  Surgeon: Francisca Redell BROCKS, MD;  Location: [facility] ORS;  Service: Urology;  Laterality: Left;    Home Medications:  Medications Ordered Prior to Encounter[1]  Allergies: Allergies[2]  Family History: No family history on file.  Social History:  reports that he does not use drugs. No history on file for tobacco use and alcohol use.  ROS: Pertinent ROS in HPI  Physical Exam: BP 135/85   Pulse 64   Wt 215 lb (97.5 kg)   SpO2 96%   BMI 33.67 kg/m   Constitutional:  Well nourished. Alert and oriented, No acute distress. HEENT: [place] AT, mask in place.   Trachea midline. Cardiovascular: No clubbing, cyanosis, or edema. Respiratory: Normal respiratory effort, no increased work of breathing. Neurologic: Grossly intact, no focal deficits, moving all 4 extremities. Psychiatric: Normal mood and affect.  Laboratory Data: See Epic and HPI   I have reviewed the labs.   Pertinent  Imaging: KUB, radiologist interpretation still pending I have independently reviewed the films.  See HPI.   Assessment: Patient with history of left renal calculus, now status post ESWL with complete radiographic resolution. Clinically asymptomatic. No evidence of residual stone burden or complications.  Plan: - Reassurance provided regarding successful stone clearance. - Encourage ongoing hydration (>=2-2.5 L/day) to reduce recurrence risk. - Discussed dietary stone-prevention strategies tailored to stone type if known (e.g., limiting sodium, moderating animal protein, maintaining normal calcium intake). - Continue routine activity as tolerated. - Return to clinic if symptoms recur (flank pain, hematuria, fever). Tanner Schaefer sent for analysis  Return for Follow up pending labs.  These notes generated with voice recognition software. I apologize for typographical errors.  Tanner Schaefer  [facility] [address]  [address] [place], [place] [address] [phone]       [1]  Current Outpatient Medications on File Prior to Visit  Medication Sig Dispense Refill   allopurinol (ZYLOPRIM) 100 MG tablet Take 100 mg by mouth.     aspirin 81 MG chewable tablet Chew 81 mg by mouth daily.     atenolol (TENORMIN) 25 MG tablet Take 25 mg by mouth daily.     JANUVIA 100 MG tablet Take 100 mg by mouth daily.     ketorolac  (TORADOL ) 10 MG tablet Take 1 tablet (10 mg total) by mouth every 6 (six) hours as needed. 10 tablet 0   losartan (COZAAR) 100 MG tablet Take 100 mg by mouth daily.     metFORMIN (GLUCOPHAGE) 500 MG  tablet Take 500 mg by mouth 2 (two) times daily with a meal.     tamsulosin  (FLOMAX ) 0.4 MG CAPS capsule Take 1 capsule (0.4 mg total) by mouth daily after supper. 90 capsule 1   No current facility-administered medications on file prior to visit.  [2] No Known Allergies

## 2024-06-25 ENCOUNTER — Ambulatory Visit
Admission: RE | Admit: 2024-06-25 | Discharge: 2024-06-25 | Disposition: A | Discharge status: Home / Self Care | Attending: Urology | Admitting: Urology

## 2024-06-25 ENCOUNTER — Ambulatory Visit: Admitting: Urology

## 2024-06-25 ENCOUNTER — Ambulatory Visit
Admission: RE | Admit: 2024-06-25 | Discharge: 2024-06-25 | Disposition: A | Discharge status: Home / Self Care | Source: Ambulatory Visit | Attending: Urology | Admitting: Urology

## 2024-06-25 VITALS — BP 135/85 | HR 64 | Wt 215.0 lb

## 2024-06-25 DIAGNOSIS — N201 Calculus of ureter: Secondary | ICD-10-CM | POA: Diagnosis present

## 2024-06-25 LAB — MICROSCOPIC EXAMINATION

## 2024-06-25 NOTE — Addendum Note (Signed)
 Addended by: Sabeen Piechocki A on: 06/25/2024 04:10 PM   Modules accepted: Orders

## 2024-06-25 NOTE — Patient Instructions (Signed)
Kidney stones can form due to one or all of the following:   1. Low fluid Intake  2. Low citrate in the urine  3. Too much salt, oxalate, and animal protein   The following dietary changes may decrease your risk of forming stones.   1. Increase your fluid intake to around 2.5 - 3 liters per day. Diluting the urine hinders the formation of stones. You should drink enough fluid to make 2 liters of urine a day. You know you are drinking enough fluid if your urine is clear.   2. Decrease your salt intake to less than 2000mg/day  This will decrease the amount of calcium excreted in your urine. Eat more fresh or frozen vegetables instead of canned. Eat less processed meats. At restaurants request your food to be prepared without salt or high sodium seasoning.   3. Limit the amount of Oxalate containing foods in your diet to 50mg per day.  This will decrease the amount of oxalate excreted in your urine.   Spinach  Potato Nuts  Peanut Butter  Chocolate   4. Limit the amount of animal proteins in your diet to approximately the size of 1 deck of cards per meal This will decrease the amount of uric acid excreted in your urine.   Fish  Liver  Chicken  Red Meat - no more than 3 servings per week   5. Increase the amount of citrate in your diet. Most fruits and vegetables are high in citrate. Increasing the amount of fruit and vegetable your diet will raise your citrate.  Lemons and limes have the highest amount of citrate. A recipe for lemonade may be helpful to increase citrate in your diet: 1/2 cup concentrated lemon juice to 2 quarts of water. Sweeten to taste.  

## 2024-06-26 LAB — URINALYSIS, COMPLETE
Bilirubin, UA: NEGATIVE
Glucose, UA: NEGATIVE
Ketones, UA: NEGATIVE
Leukocytes,UA: NEGATIVE
Nitrite, UA: NEGATIVE
Protein,UA: NEGATIVE
Specific Gravity, UA: 1.03 (ref 1.005–1.030)
Urobilinogen, Ur: 0.2 mg/dL (ref 0.2–1.0)
pH, UA: 5.5 (ref 5.0–7.5)

## 2024-06-26 LAB — MICROSCOPIC EXAMINATION

## 2024-07-03 ENCOUNTER — Ambulatory Visit: Payer: Self-pay | Admitting: Urology

## 2024-07-04 ENCOUNTER — Ambulatory Visit: Payer: Self-pay | Admitting: Urology

## 2024-07-04 LAB — STONE ANALYSIS
Calcium Oxalate Monohydrate: 100 %
Weight Calculi: 73 mg
